# Patient Record
Sex: Male | Born: 1950 | Race: White | Hispanic: No | Marital: Married | Smoking: Former smoker
Health system: Southern US, Community
[De-identification: ages and names within clinical notes are randomized; demographics above are authoritative.]

## PROBLEM LIST (undated history)

## (undated) DIAGNOSIS — Z8679 Personal history of other diseases of the circulatory system: Secondary | ICD-10-CM

## (undated) DIAGNOSIS — K635 Polyp of colon: Secondary | ICD-10-CM

## (undated) DIAGNOSIS — M1990 Unspecified osteoarthritis, unspecified site: Secondary | ICD-10-CM

## (undated) DIAGNOSIS — E785 Hyperlipidemia, unspecified: Secondary | ICD-10-CM

## (undated) DIAGNOSIS — R9431 Abnormal electrocardiogram [ECG] [EKG]: Secondary | ICD-10-CM

## (undated) DIAGNOSIS — I2129 ST elevation (STEMI) myocardial infarction involving other sites: Secondary | ICD-10-CM

## (undated) DIAGNOSIS — I2510 Atherosclerotic heart disease of native coronary artery without angina pectoris: Secondary | ICD-10-CM

## (undated) DIAGNOSIS — I10 Essential (primary) hypertension: Secondary | ICD-10-CM

## (undated) HISTORY — DX: Unspecified osteoarthritis, unspecified site: M19.90

## (undated) HISTORY — PX: CARDIAC CATHETERIZATION: SHX172

## (undated) HISTORY — DX: Abnormal electrocardiogram (ECG) (EKG): R94.31

## (undated) HISTORY — DX: ST elevation (STEMI) myocardial infarction involving other sites: I21.29

## (undated) HISTORY — DX: Hyperlipidemia, unspecified: E78.5

## (undated) HISTORY — PX: ANGIOPLASTY: SHX39

## (undated) HISTORY — DX: Personal history of other diseases of the circulatory system: Z86.79

## (undated) HISTORY — DX: Essential (primary) hypertension: I10

---

## 1998-06-10 DIAGNOSIS — I2129 ST elevation (STEMI) myocardial infarction involving other sites: Secondary | ICD-10-CM

## 1998-06-10 HISTORY — DX: ST elevation (STEMI) myocardial infarction involving other sites: I21.29

## 1998-07-09 ENCOUNTER — Inpatient Hospital Stay (HOSPITAL_COMMUNITY): Admission: EM | Admit: 1998-07-09 | Discharge: 1998-07-12 | Payer: Self-pay | Admitting: Emergency Medicine

## 2004-08-14 ENCOUNTER — Ambulatory Visit: Payer: Self-pay | Admitting: Internal Medicine

## 2004-08-21 ENCOUNTER — Ambulatory Visit: Payer: Self-pay | Admitting: Internal Medicine

## 2005-03-13 ENCOUNTER — Ambulatory Visit: Payer: Self-pay | Admitting: Internal Medicine

## 2005-03-18 ENCOUNTER — Ambulatory Visit (HOSPITAL_COMMUNITY): Admission: RE | Admit: 2005-03-18 | Discharge: 2005-03-18 | Payer: Self-pay | Admitting: Internal Medicine

## 2005-03-18 ENCOUNTER — Ambulatory Visit: Payer: Self-pay | Admitting: Internal Medicine

## 2005-11-16 ENCOUNTER — Ambulatory Visit: Payer: Self-pay | Admitting: Internal Medicine

## 2007-04-07 IMAGING — US US SOFT TISSUE HEAD/NECK
1 series · 14 of 25 positions shown · non-contrast
Comparison: none

CLINICAL DATA: Goiter.
THYROID ULTRASOUND:
TECHNIQUE: Ultrasound examination of the thyroid gland and adjacent soft tissue structures was performed.

[Series 1: unknown · 0.11mm/px · 14 of 29 slices shown]
[im 1/29]
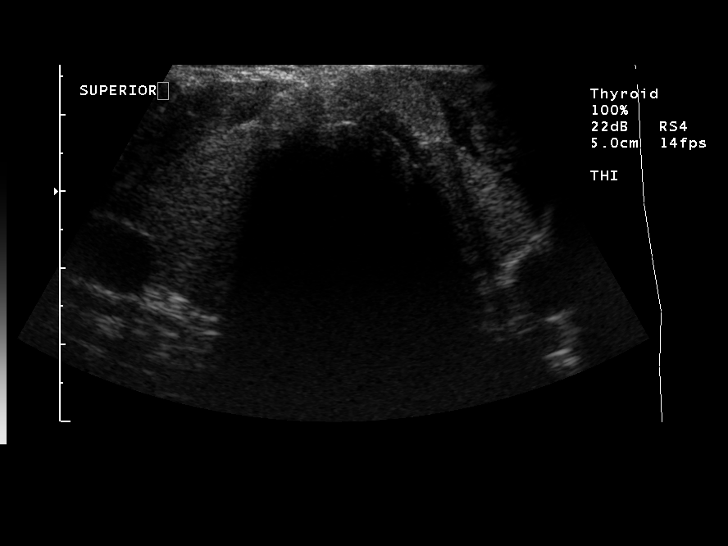
[im 3/29]
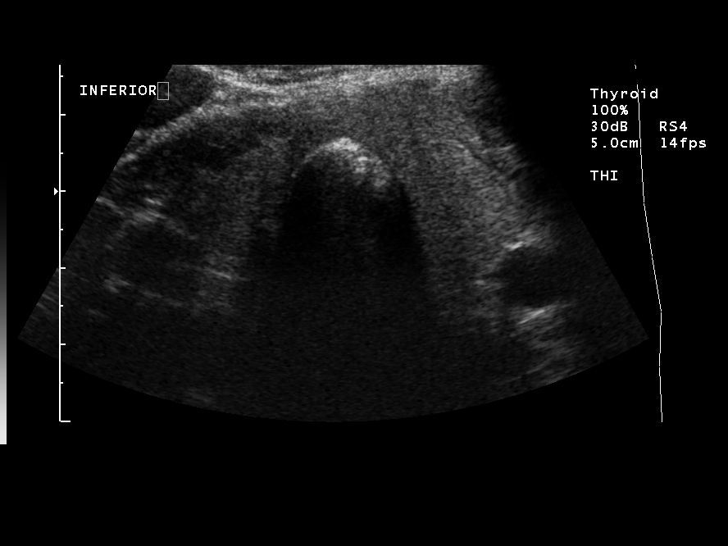
[im 5/29]
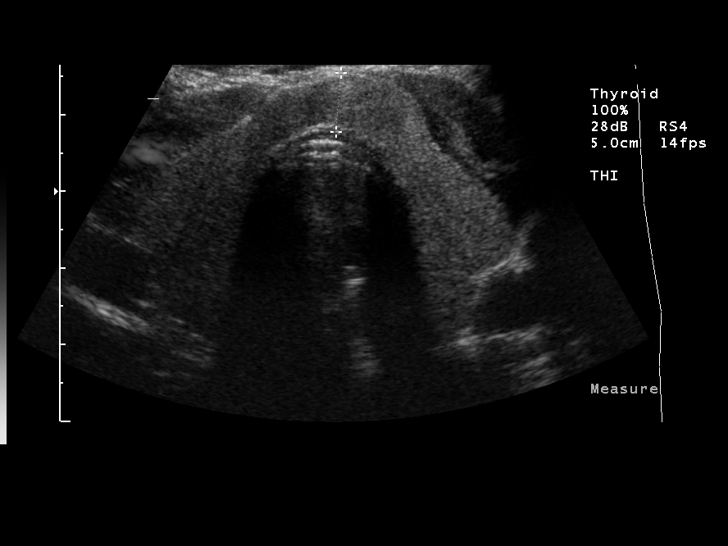
[im 8/29]
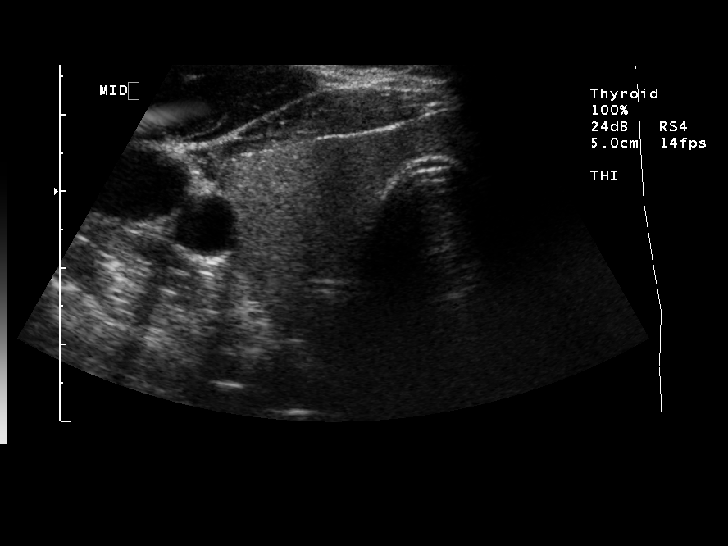
[im 10/29]
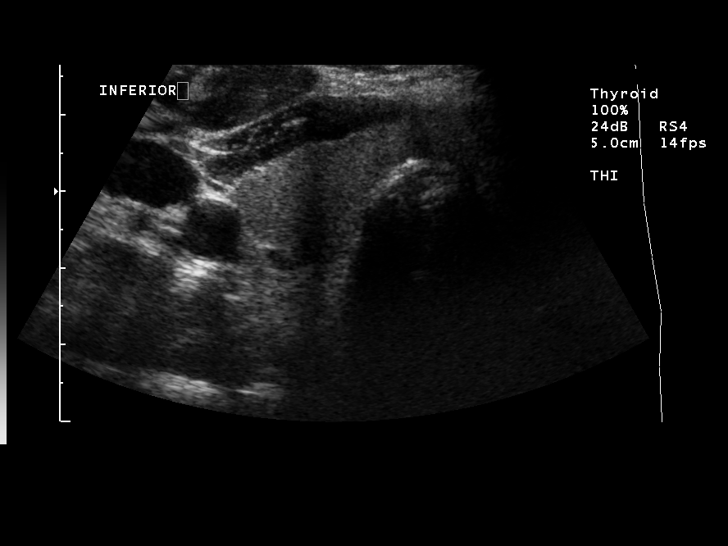
[im 11/29]
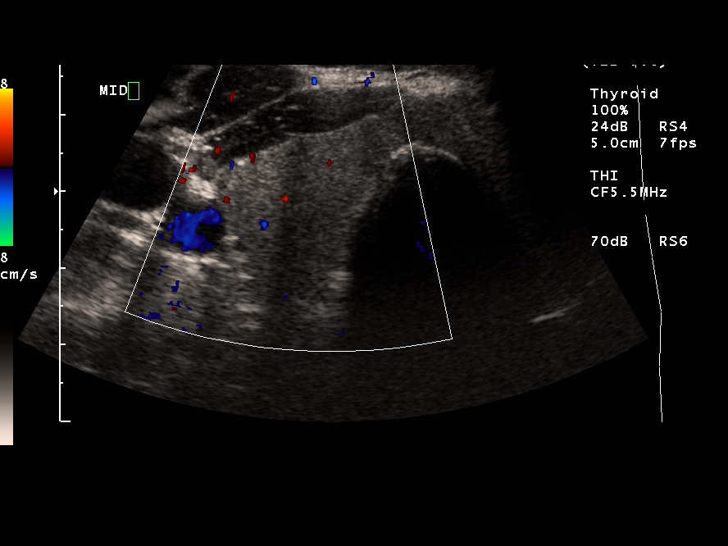
[im 13/29]
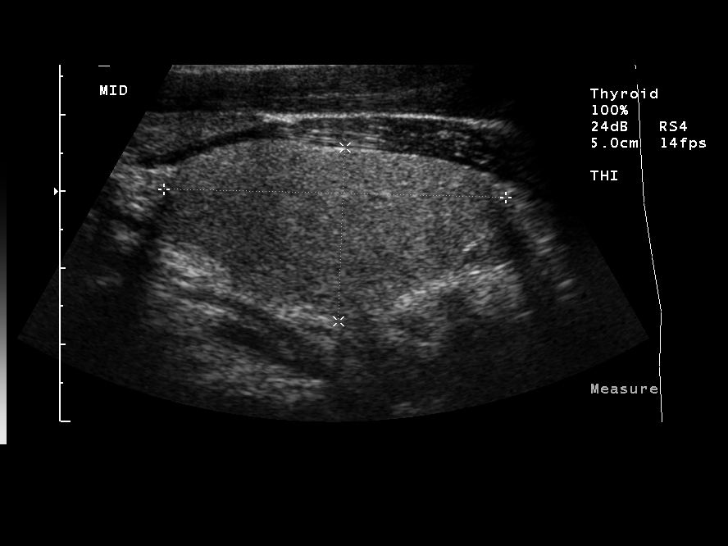
[im 16/29]
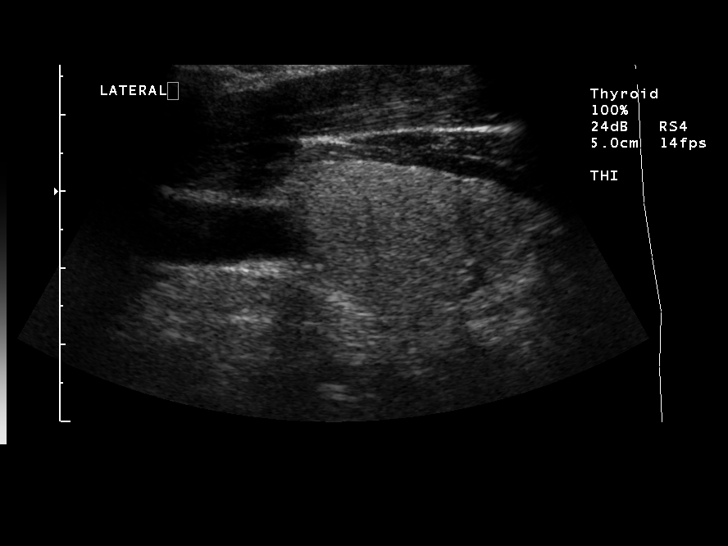
[im 18/29]
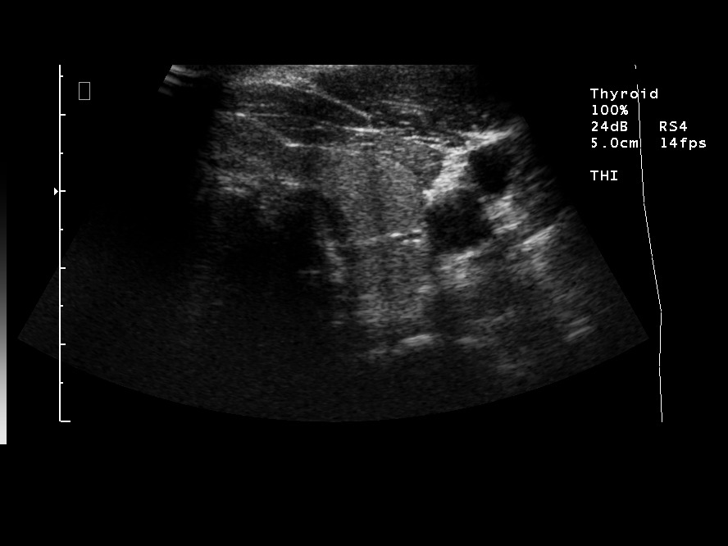
[im 19/29]
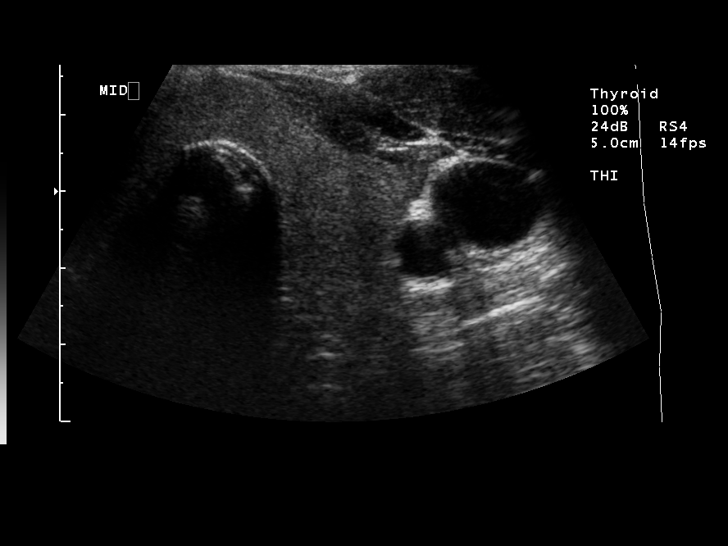
[im 22/29]
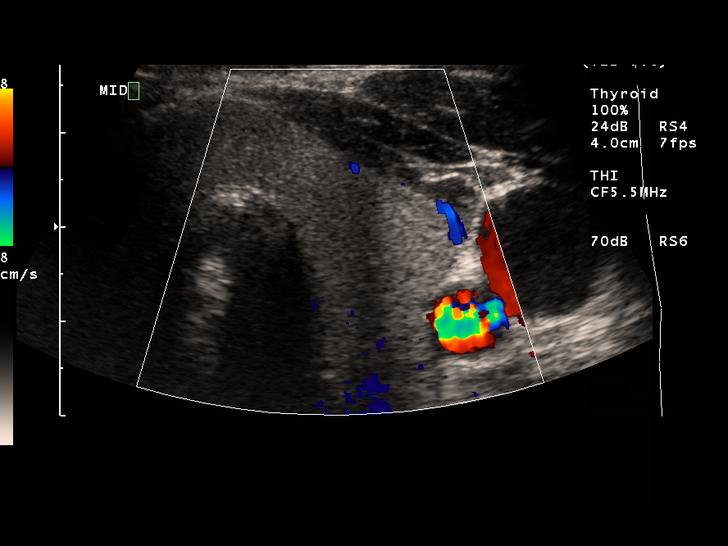
[im 24/29]
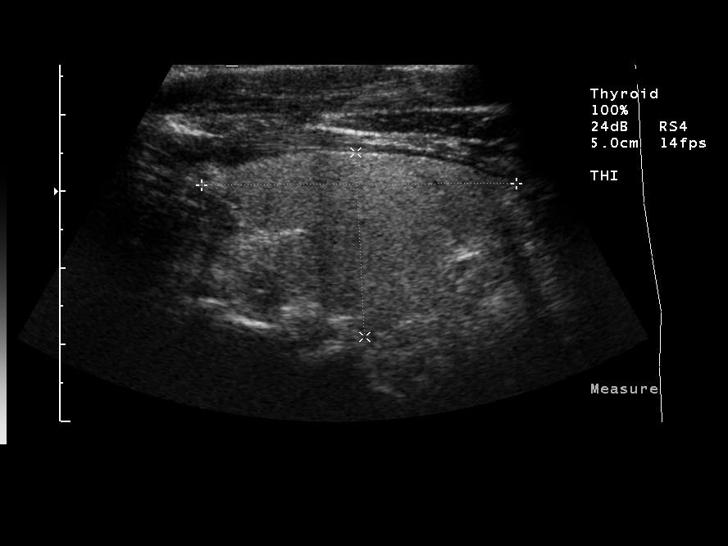
[im 26/29]
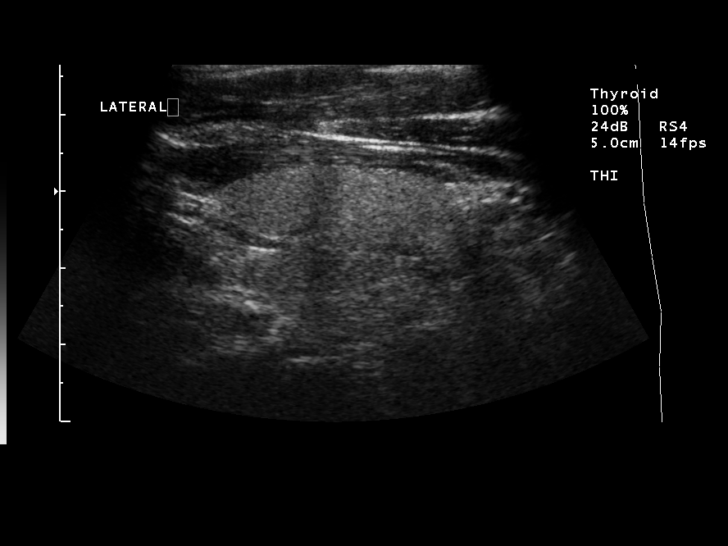
[im 29/29]
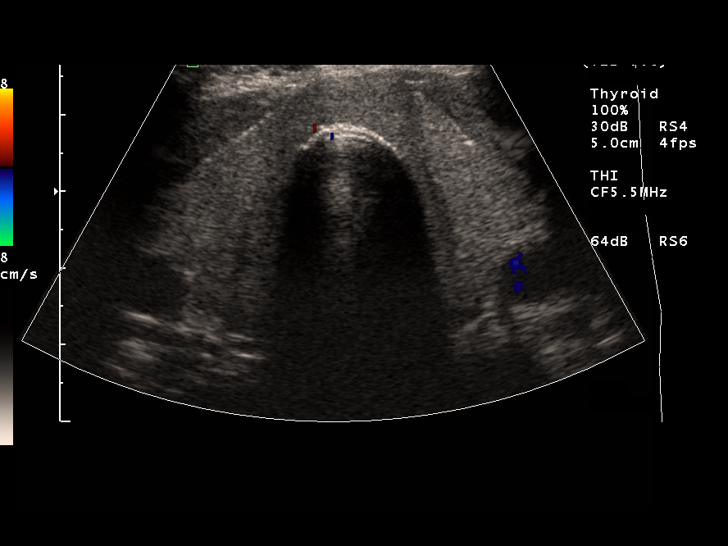

[14 of 25 positions shown; findings below may reference images not displayed]

FINDINGS: The scan demonstrates that the isthmus is slightly prominent.  On a few images, there is suggestion of a very subtle nodule on the left side of the isthmus measuring approximately 10 mm maximum diameter.  The remainder of the gland is homogeneous and normal in size.  The right lobe is 4.5 x 2.3 x 2.3 cm, and the left lobe is 4.1 x 2.4 x 1.8 cm.  The right side of the isthmus is 8 mm in thickness within normal limits.
IMPRESSION: Probable subtle nodule on the left side of the isthmus measuring approximately 1 cm in diameter. Otherwise normal thyroid ultrasound.  Given the solitary nature of the lesion in a male of this age, I recommend consideration of a percutaneous needle biopsy. It may be that a lesion will not be defined at the time of a possible biopsy, but I feel there is probably a subtle lesion in the left side of the isthmus.

## 2008-02-16 ENCOUNTER — Ambulatory Visit: Payer: Self-pay | Admitting: Internal Medicine

## 2008-02-16 LAB — CONVERTED CEMR LAB
ALT: 50 units/L (ref 0–53)
Albumin: 4.7 g/dL (ref 3.5–5.2)
Alkaline Phosphatase: 73 units/L (ref 39–117)
Cholesterol: 141 mg/dL (ref 0–200)
Potassium: 4.3 meq/L (ref 3.5–5.1)
Sodium: 139 meq/L (ref 135–145)
Total CHOL/HDL Ratio: 4.1
Total Protein: 7.5 g/dL (ref 6.0–8.3)
VLDL: 22 mg/dL (ref 0–40)

## 2008-02-20 ENCOUNTER — Ambulatory Visit: Payer: Self-pay

## 2008-07-06 ENCOUNTER — Encounter (INDEPENDENT_AMBULATORY_CARE_PROVIDER_SITE_OTHER): Payer: Self-pay | Admitting: *Deleted

## 2008-09-14 ENCOUNTER — Telehealth: Payer: Self-pay | Admitting: Internal Medicine

## 2008-11-12 ENCOUNTER — Encounter (INDEPENDENT_AMBULATORY_CARE_PROVIDER_SITE_OTHER): Payer: Self-pay | Admitting: *Deleted

## 2008-11-23 ENCOUNTER — Ambulatory Visit: Payer: Self-pay | Admitting: Internal Medicine

## 2008-11-23 DIAGNOSIS — E7800 Pure hypercholesterolemia, unspecified: Secondary | ICD-10-CM | POA: Insufficient documentation

## 2008-11-23 DIAGNOSIS — I2510 Atherosclerotic heart disease of native coronary artery without angina pectoris: Secondary | ICD-10-CM | POA: Insufficient documentation

## 2008-11-28 LAB — CONVERTED CEMR LAB
ALT: 33 units/L (ref 0–53)
AST: 28 units/L (ref 0–37)
Albumin: 4.3 g/dL (ref 3.5–5.2)
BUN: 22 mg/dL (ref 6–23)
CO2: 27 meq/L (ref 19–32)
Cholesterol: 154 mg/dL (ref 0–200)
Creatinine, Ser: 1.1 mg/dL (ref 0.4–1.5)
GFR calc non Af Amer: 72.95 mL/min (ref >60)
Glucose, Bld: 102 mg/dL — ABNORMAL HIGH (ref 70–99)
LDL Cholesterol: 90 mg/dL (ref 0–99)
Potassium: 4.3 meq/L (ref 3.5–5.1)
Total Bilirubin: 1.3 mg/dL — ABNORMAL HIGH (ref 0.3–1.2)
Triglycerides: 151 mg/dL — ABNORMAL HIGH (ref 0.0–149.0)

## 2008-12-20 ENCOUNTER — Ambulatory Visit: Payer: Self-pay | Admitting: Internal Medicine

## 2008-12-20 DIAGNOSIS — R9431 Abnormal electrocardiogram [ECG] [EKG]: Secondary | ICD-10-CM

## 2008-12-20 HISTORY — DX: Abnormal electrocardiogram (ECG) (EKG): R94.31

## 2009-11-26 ENCOUNTER — Ambulatory Visit: Payer: Self-pay | Admitting: Internal Medicine

## 2009-11-26 ENCOUNTER — Encounter: Payer: Self-pay | Admitting: Internal Medicine

## 2010-03-09 LAB — CONVERTED CEMR LAB
ALT: 41 units/L (ref 0–53)
AST: 31 units/L (ref 0–37)
Alkaline Phosphatase: 68 units/L (ref 39–117)
Bilirubin, Direct: 0.1 mg/dL (ref 0.0–0.3)
Cholesterol: 133 mg/dL (ref 0–200)
Direct LDL: 63.5 mg/dL
HDL: 35.2 mg/dL — ABNORMAL LOW (ref >39.00)
Hgb A1c MFr Bld: 5.9 % (ref 4.6–6.5)
Potassium: 4.8 meq/L (ref 3.5–5.1)
Sodium: 140 meq/L (ref 135–145)
Total CHOL/HDL Ratio: 4

## 2010-03-11 NOTE — Assessment & Plan Note (Signed)
Summary: per check out/sf   Visit Type:  Follow-up  CC:  No cardiac complaints.  History of Present Illness:  [name] [name] is a very pleasant 60 year old male with a history of coronary artery disease, hypertension, and hyperlipidemia. 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  Catheterization at that time showed an LAD 60-70% mid lesion, ramus 30% ostial lesion, circumflex was dominant with an occluded obtuse marginal branch which was angioplastied.  EF was 55%. He had a Cardiolite in 2004 which showed an EF of 66%.  No ischemia.   We saw him in January 2010 and ordered a f/u Myoview to assess for progressive CAD. This was normal with EF 67% no ischemia or infarct.   Returns for f/u. Remains active at work without CP or SOB. No palpitation or dizziness. Not exercising regularly. Taking meds without problems except for very occasionally flushing with Niasapan.  Has not had recent lipids.Has lost 7 pounds.  Current Medications (verified): 1)  Labetalol Hcl 200 Mg Tabs (Labetalol Hcl) .Marland Kitchen.. 1 Tab Once Daily 2)  Lipitor 80 Mg Tabs (Atorvastatin Calcium) .Marland Kitchen.. 1 Tablet By Mouth Once A Day 3)  Lisinopril 40 Mg Tabs (Lisinopril) .... Take 1 Tablet By Mouth Once A Day 4)  Niaspan 500 Mg Cr-Tabs (Niacin (Antihyperlipidemic)) .Marland Kitchen.. 1 Tablet By Mouth At Bedtime 5)  Nitrostat 0.4 Mg Subl (Nitroglycerin) .... Place 1 Tablet Under Tongue As Directed 6)  Fish Oil Concentrate 1000 Mg Caps (Omega-3 Fatty Acids) .... Two Daily 7)  Aspirin 81 Mg Tbec (Aspirin) .... Sometimes  Allergies (verified): No Known Drug Allergies  Review of Systems       As per HPI and past medical history; otherwise all systems negative.   Vital Signs:  Patient profile:   60 year old male Height:      66 inches Weight:      171.75 pounds BMI:     27.82 Pulse rate:   65 / minute Pulse rhythm:   regular Resp:     18 per  minute BP sitting:   114 / 80  (left arm) Cuff size:   large  Vitals Entered By: Vikki Ports (November 26, 2009 8:58 AM)  Physical Exam  General:  Well appearing. no resp difficulty HEENT: normal Neck: supple. no JVD. Carotids 2+ bilat; no bruits. No lymphadenopathy or thryomegaly appreciated. Cor: PMI nondisplaced. Regular rate & rhythm. No rubs, gallops, murmur. Lungs: clear Abdomen: soft, nontender, nondistended. No hepatosplenomegaly. No bruits or masses. Good bowel sounds. Extremities: no cyanosis, clubbing, rash, edema Neuro: alert & orientedx3, cranial nerves grossly intact. moves all 4 extremities w/o difficulty. affect pleasant    Impression & Recommendations:  Problem # 1:  CORONARY ATHEROSCLEROSIS NATIVE CORONARY ARTERY (ICD-414.01) Stable. No evidence of ischemia. Continue current regimen.  Problem # 2:  PURE HYPERCHOLESTEROLEMIA (ICD-272.0) Due for lipids today. Goal LDL < 70. Historically HDL has been low and refractory to treatment. That said, we did discuss neutral result of AIM-HIGH trial with Niaspan. He said he is tolerating well and has decided to continue.   Other Orders: EKG w/ Interpretation (93000) TLB-Lipid Panel (80061-LIPID) TLB-Hepatic/Liver Function Pnl (80076-HEPATIC) TLB-BMP (Basic Metabolic Panel-BMET) (80048-METABOL) TLB-A1C / Hgb A1C (Glycohemoglobin) (83036-A1C)  Patient Instructions: 1)  Your physician recommends that you schedule a follow-up appointment in: 9 months with Dr. Gala Romney 2)  Your physician recommends that you  have FASTING lipid profile,liver, bmp, HbA1C today.  3)  Your physician recommends that you continue on your current medications as directed. Please refer to the Current Medication list given to you today.

## 2010-06-12 ENCOUNTER — Other Ambulatory Visit: Payer: Self-pay | Admitting: *Deleted

## 2010-06-12 MED ORDER — ATORVASTATIN CALCIUM 80 MG PO TABS: 80.0000 mg | ORAL_TABLET | Freq: Every day | ORAL | Status: DC

## 2010-06-24 NOTE — Assessment & Plan Note (Signed)
[facility]                            CARDIOLOGY OFFICE NOTE   NAME:[name] [name], [name] [name]                    MRN:          [id number]  DATE:02/16/2008                            DOB:          Dec 04, 1950    INTERVAL HISTORY:  Mr. Ruacho is a very pleasant 60 year old male with  a history of coronary artery disease, hypertension, and hyperlipidemia.  He suffered an inferior lateral wall myocardial infarction in May 2000  and was treated with angioplasty, but no stenting of the left  circumflex.  Catheterization at that time showed an LAD 60-70% mid  lesion, ramus 30% ostial lesion, circumflex was dominant with an  occluded obtuse marginal branch which was angioplastied.  EF was 55%.  He had a Cardiolite in 2004 which showed an EF of 66%.  No ischemia.   He returns today for routine followup.  He is doing very well.  He has  not been as active as he had been before, but does do some walking  around the block.  No chest pain or shortness of breath.  He does note  over the holidays his blood pressure spiked up into the 150 range.  He  thinks this may have been due to the extra salt he was eating.  He has  not had any orthopnea, PND, or lower extremity edema.   CURRENT MEDICATIONS:  1. Aspirin 325 a day.  2. Niaspan 500 a day.  3. Lisinopril 40 a day.  4. Labetalol 200 t.i.d.  5. Lipitor 80 a day.   PHYSICAL EXAMINATION:  GENERAL:  He is well appearing in no acute  distress.  He ambulates around the clinic without any respiratory  difficulty.  VITAL SIGNS:  Blood pressure is 110/72, heart rate 66, weight is 174.  HEENT:  Normal.  NECK:  Supple.  No JVD.  Carotids are 2+ bilaterally without bruits.  There is no lymphadenopathy or thyromegaly.  CARDIAC:  PMI is nondisplaced.  Regular rate and rhythm.  No murmurs,  rubs, or gallops.  LUNGS:  Clear.  ABDOMEN:  Soft, nontender, nondistended.  No hepatosplenomegaly.  No  bruits.  No masses.  Good bowel  sounds.  EXTREMITIES:  Warm with no cyanosis, clubbing, or edema.  No rash.  NEUROLOGIC:  Alert and oriented x3.  Cranial nerves II through XII are  intact.  Moves all 4 extremities without difficulty.  Affect is  pleasant.   EKG shows sinus rhythm at a rate of 64.  There is J-point elevation  inferiorly which is likely early repolarization.   ASSESSMENT/PLAN:  1. Coronary artery disease.  This appears stable; however, he does      have fairly significant disease.  Given that he is not that active,      I would like to proceed with a treadmill Myoview just to make sure      he does not have worsening blockages.  2. Hypertension, currently well controlled.  I told him to be careful      with his salt intake.  3. Hyperlipidemia.  He is due for lipid panel today.   DISPOSITION:  I will see him back in 6 months for followup.     Bevelyn Buckles. Bensimhon, MD  Electronically Signed    DRB/MedQ  DD: 02/16/2008  DT: 02/17/2008  Job #: [id number]

## 2010-06-27 NOTE — Assessment & Plan Note (Signed)
[facility]                              CARDIOLOGY OFFICE NOTE   NAME:[name], [name]                    MRN:          [id number]  DATE:11/16/2005                            DOB:          07/29/1950    PATIENT IDENTIFICATION:  [name] is a very pleasant 60 year old male  who returns for routine followup.   PROBLEM LIST:  1. Coronary artery disease.      a.     Status post inferolateral myocardial infarction in May of 2000,       treated with percutaneous transluminal coronary angioplasty but no       stent.      b.     Cardiac catheterization at that time showed left main with minor       irregularities, left anterior descending 60% to 70% mid, which was       treated medically, ramus 30% ostial, left circumflex was dominant with       an occluded inferior obtuse marginal branch, which was angioplastied.       Ejection fraction 55%.      c.     Treadmill Cardiolite March 2004, good exercise tolerance,       ejection fraction 66%, no evidence of ischemia.  2. Hypertension.  3. Hyperlipidemia with LDL.  4. History of medication noncompliance.   CURRENT MEDICATIONS:  1. Aspirin 325 a day.  2. Labetalol 200 b.i.d.  3. Lipitor 40 a day.  4. Niaspan 500 a day.  5. Lisinopril 40 a day.   INTERVAL HISTORY:  Mr. Meter returns today for routine followup.  He is  doing quite well.  He has been watching his diet and exercising, and he has  lost just about 20 pounds.  He is not having chest pains, no dyspnea.  He  says his blood pressure has been somewhat better controlled.   PHYSICAL EXAMINATION:  He is well-appearing, in no acute distress, ambulates  around the clinic without any difficulty.  Respirations are unlabored.  Blood pressure is 130/70 in the left arm manually, heart rate 69, his weight  is 167.  HEENT:  Sclerae anicteric, EOMI.  There is no xanthelasma.  Mucous membranes  are moist.  NECK:  Supple, no JVD.  Carotids are 2+  bilaterally without any bruits.  There is no lymphadenopathy or thyromegaly.  CARDIAC:  Distant heart sounds, regular rate and rhythm, no obvious murmurs,  rubs or gallops.  LUNGS:  Clear.  ABDOMEN:  Soft, nontender, nondistended.  No hepatosplenomegaly, no bruits,  no masses.  EXTREMITIES:  Warm with no cyanosis, clubbing or edema.  NEUROLOGIC:  He is alert and oriented x3.  Cranial nerves II-XII are intact.  He moves all 4 extremities without difficulty.   EKG shows normal sinus rhythm at a rate of 69, with no ST-T wave changes.   ASSESSMENT:  1. Coronary artery disease.  This is stable without any ongoing angina.      Continue current therapy.  2. Hypertension, still suboptimally controlled.  We will increase his      labetalol to 300 b.i.d.  I  told him if he experiences significant      amount of fatigue with this, we can cut him back and start him on a      diuretic or amlodipine.  3. Hyperlipidemia.  We will check lipids today.   DISPOSITION:  We will see him back for routine evaluation in 6 months.  I  congratulated him on his diet and weight loss and exercise efforts, and  encouraged him to continue these.       Bevelyn Buckles. Bensimhon, MD     DRB/MedQ  DD:  11/16/2005  DT:  11/18/2005  Job #:  [id number]

## 2010-07-30 ENCOUNTER — Other Ambulatory Visit: Payer: Self-pay | Admitting: *Deleted

## 2010-07-30 MED ORDER — LISINOPRIL 40 MG PO TABS: 40.0000 mg | ORAL_TABLET | Freq: Every day | ORAL | Status: DC

## 2010-11-30 ENCOUNTER — Other Ambulatory Visit: Payer: Self-pay | Admitting: Internal Medicine

## 2011-04-08 ENCOUNTER — Other Ambulatory Visit (HOSPITAL_COMMUNITY): Payer: Self-pay | Admitting: Internal Medicine

## 2011-05-05 ENCOUNTER — Other Ambulatory Visit: Payer: BC Managed Care – PPO

## 2011-05-05 ENCOUNTER — Ambulatory Visit (INDEPENDENT_AMBULATORY_CARE_PROVIDER_SITE_OTHER): Payer: BC Managed Care – PPO | Admitting: Internal Medicine

## 2011-05-05 VITALS — BP 110/64 | HR 53 | Ht 66.0 in | Wt 164.0 lb

## 2011-05-05 DIAGNOSIS — E7800 Pure hypercholesterolemia, unspecified: Secondary | ICD-10-CM

## 2011-05-05 DIAGNOSIS — I2510 Atherosclerotic heart disease of native coronary artery without angina pectoris: Secondary | ICD-10-CM

## 2011-05-05 LAB — CBC WITH DIFFERENTIAL/PLATELET
Basophils Absolute: 0 10*3/uL (ref 0.0–0.1)
HCT: 43.2 % (ref 39.0–52.0)
Hemoglobin: 14.6 g/dL (ref 13.0–17.0)
Lymphs Abs: 1.7 10*3/uL (ref 0.7–4.0)
MCV: 86.2 fl (ref 78.0–100.0)
Neutro Abs: 3.8 10*3/uL (ref 1.4–7.7)
Platelets: 192 10*3/uL (ref 150.0–400.0)
RBC: 5.02 Mil/uL (ref 4.22–5.81)
WBC: 6.3 10*3/uL (ref 4.5–10.5)

## 2011-05-05 LAB — BASIC METABOLIC PANEL
BUN: 22 mg/dL (ref 6–23)
CO2: 29 mEq/L (ref 19–32)
Chloride: 102 mEq/L (ref 96–112)
GFR: 82.66 mL/min (ref >60.00)
Glucose, Bld: 111 mg/dL — ABNORMAL HIGH (ref 70–99)
Potassium: 4.5 mEq/L (ref 3.5–5.1)

## 2011-05-05 LAB — LIPID PANEL
HDL: 47 mg/dL (ref >39.00)
LDL Cholesterol: 38 mg/dL (ref 0–99)
Triglycerides: 41 mg/dL (ref 0.0–149.0)
VLDL: 8.2 mg/dL (ref 0.0–40.0)

## 2011-05-05 LAB — HEMOGLOBIN A1C: Hgb A1c MFr Bld: 5.4 % (ref 4.6–6.5)

## 2011-05-05 LAB — HEPATIC FUNCTION PANEL
Alkaline Phosphatase: 53 U/L (ref 39–117)
Bilirubin, Direct: 0.2 mg/dL (ref 0.0–0.3)
Total Bilirubin: 0.6 mg/dL (ref 0.3–1.2)
Total Protein: 7.4 g/dL (ref 6.0–8.3)

## 2011-05-05 NOTE — Assessment & Plan Note (Signed)
LDL at goal at last visit TGs mildly up but has been dieting. Will recheck. Continue statin. Discussed recent Niaspan studies with minimal clinical benefit but he ould like to continue. I think that is reasonable particularly given high TGs.

## 2011-05-05 NOTE — Assessment & Plan Note (Signed)
No evidence of ischemia. Continue current regimen.   

## 2011-05-05 NOTE — Progress Notes (Signed)
HPI:   [name] [name] is a very pleasant 61 year old male with a history of coronary artery disease, hypertension, and hyperlipidemia. 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  Catheterization at that time showed an LAD 60-70% mid lesion, ramus 30% ostial lesion, circumflex was dominant with an occluded obtuse marginal branch which was angioplastied.  EF was 55%. He had a Cardiolite in 2004 which showed an EF of 66%.  No ischemia.  We saw him in January 2010 and ordered a f/u Myoview to assess for progressive CAD. This was normal with EF 67% no ischemia or infarct.   Returns for f/u. Remains active at work without CP or SOB. No palpitation or dizziness. Not exercising regularly. Taking meds without problems including Niasapan.  Has lost 14 pounds over past year.   ROS: All systems negative except as listed in HPI, PMH and Problem List.  Past Medical History  Diagnosis Date  . Hx of coronary artery disease   . Hypertension   . Hyperlipidemia   . Myocardial infarction (lateral wall) May 2000    inferior lateral wall myocardial infarction    Current Outpatient Prescriptions  Medication Sig Dispense Refill  . aspirin 81 MG tablet Take 81 mg by mouth daily.      Marland Kitchen atorvastatin (LIPITOR) 80 MG tablet Take 1 tablet (80 mg total) by mouth daily.  30 tablet  11  . labetalol (NORMODYNE) 200 MG tablet TAKE 1 TABLET 3 TIMES A DAY  90 tablet  1  . lisinopril (PRINIVIL,ZESTRIL) 40 MG tablet Take 1 tablet (40 mg total) by mouth daily.  30 tablet  12  . NIASPAN 500 MG CR tablet TAKE 1 TABLET AT BEDTIME AS DIRECTED WITH 1 ASPIRIN 1/2 HOUR PRIOR TO DOSE  30 tablet  3     PHYSICAL EXAM: Filed Vitals:   05/05/11 0931  BP: 110/64  Pulse: 53   General:  Well appearing. No resp difficulty HEENT: normal Neck: supple. JVP flat. Carotids 2+ bilaterally; no bruits. No lymphadenopathy or thryomegaly  appreciated. Cor: PMI normal. Regular rate & rhythm. No rubs, gallops or murmurs. Lungs: clear Abdomen: soft, nontender, nondistended. No hepatosplenomegaly. No bruits or masses. Good bowel sounds. Extremities: no cyanosis, clubbing, rash, edema. Varicose  Veins on R Neuro: alert & orientedx3, cranial nerves grossly intact. Moves all 4 extremities w/o difficulty. Affect pleasant.    ECG: Sinus bradycardia 53. No ST-T wave abnormalities.     ASSESSMENT & PLAN:

## 2011-05-05 NOTE — Patient Instructions (Signed)
Your physician recommends that you schedule a follow-up appointment in: 3 MONTHS Your physician recommends that you return for lab work in: TODAY   

## 2011-05-07 ENCOUNTER — Encounter: Payer: Self-pay | Admitting: *Deleted

## 2011-07-08 ENCOUNTER — Other Ambulatory Visit: Payer: Self-pay

## 2011-07-08 MED ORDER — NIACIN ER (ANTIHYPERLIPIDEMIC) 500 MG PO TBCR: 500.0000 mg | EXTENDED_RELEASE_TABLET | Freq: Every day | ORAL | Status: DC

## 2011-07-09 ENCOUNTER — Other Ambulatory Visit (HOSPITAL_COMMUNITY): Payer: Self-pay | Admitting: *Deleted

## 2011-07-09 MED ORDER — LABETALOL HCL 200 MG PO TABS: ORAL_TABLET | ORAL | Status: DC

## 2011-07-09 MED ORDER — ATORVASTATIN CALCIUM 80 MG PO TABS: 80.0000 mg | ORAL_TABLET | Freq: Every day | ORAL | Status: DC

## 2011-07-21 ENCOUNTER — Other Ambulatory Visit (INDEPENDENT_AMBULATORY_CARE_PROVIDER_SITE_OTHER): Payer: BC Managed Care – PPO

## 2011-07-21 ENCOUNTER — Other Ambulatory Visit: Payer: Self-pay | Admitting: *Deleted

## 2011-07-21 ENCOUNTER — Ambulatory Visit: Payer: BC Managed Care – PPO | Admitting: Internal Medicine

## 2011-07-21 DIAGNOSIS — E7800 Pure hypercholesterolemia, unspecified: Secondary | ICD-10-CM

## 2011-07-21 LAB — BASIC METABOLIC PANEL
BUN: 29 mg/dL — ABNORMAL HIGH (ref 6–23)
CO2: 24 mEq/L (ref 19–32)
Calcium: 9.2 mg/dL (ref 8.4–10.5)
Chloride: 107 mEq/L (ref 96–112)
Creatinine, Ser: 1.1 mg/dL (ref 0.4–1.5)
Potassium: 4.3 mEq/L (ref 3.5–5.1)
Sodium: 139 mEq/L (ref 135–145)

## 2011-07-21 LAB — HEPATIC FUNCTION PANEL
AST: 26 U/L (ref 0–37)
Alkaline Phosphatase: 54 U/L (ref 39–117)
Total Protein: 7.1 g/dL (ref 6.0–8.3)

## 2011-07-21 LAB — LIPID PANEL
Cholesterol: 111 mg/dL (ref 0–200)
HDL: 49.3 mg/dL (ref >39.00)
Triglycerides: 74 mg/dL (ref 0.0–149.0)
VLDL: 14.8 mg/dL (ref 0.0–40.0)

## 2011-07-31 ENCOUNTER — Encounter: Payer: Self-pay | Admitting: *Deleted

## 2011-08-24 ENCOUNTER — Other Ambulatory Visit: Payer: Self-pay | Admitting: *Deleted

## 2011-08-24 MED ORDER — LISINOPRIL 40 MG PO TABS: 40.0000 mg | ORAL_TABLET | Freq: Every day | ORAL | Status: DC

## 2011-08-25 ENCOUNTER — Other Ambulatory Visit: Payer: Self-pay | Admitting: *Deleted

## 2011-08-25 MED ORDER — LISINOPRIL 40 MG PO TABS: 40.0000 mg | ORAL_TABLET | Freq: Every day | ORAL | Status: DC

## 2012-07-25 ENCOUNTER — Other Ambulatory Visit (HOSPITAL_COMMUNITY): Payer: Self-pay | Admitting: *Deleted

## 2012-07-25 MED ORDER — ATORVASTATIN CALCIUM 80 MG PO TABS: 80.0000 mg | ORAL_TABLET | Freq: Every day | ORAL | Status: DC

## 2012-08-08 ENCOUNTER — Other Ambulatory Visit (HOSPITAL_COMMUNITY): Payer: Self-pay | Admitting: *Deleted

## 2012-08-08 MED ORDER — LABETALOL HCL 200 MG PO TABS: ORAL_TABLET | ORAL | Status: DC

## 2012-08-09 ENCOUNTER — Other Ambulatory Visit: Payer: Self-pay | Admitting: *Deleted

## 2012-08-10 MED ORDER — NIACIN ER (ANTIHYPERLIPIDEMIC) 500 MG PO TBCR: 500.0000 mg | EXTENDED_RELEASE_TABLET | Freq: Every day | ORAL | Status: DC

## 2012-09-06 ENCOUNTER — Other Ambulatory Visit (HOSPITAL_COMMUNITY): Payer: Self-pay | Admitting: *Deleted

## 2012-09-06 MED ORDER — LISINOPRIL 40 MG PO TABS: 40.0000 mg | ORAL_TABLET | Freq: Every day | ORAL | Status: DC

## 2012-10-26 ENCOUNTER — Other Ambulatory Visit (HOSPITAL_COMMUNITY): Payer: Self-pay | Admitting: Internal Medicine

## 2012-10-28 ENCOUNTER — Ambulatory Visit (INDEPENDENT_AMBULATORY_CARE_PROVIDER_SITE_OTHER): Payer: PRIVATE HEALTH INSURANCE | Admitting: Family Medicine

## 2012-10-28 ENCOUNTER — Encounter: Payer: Self-pay | Admitting: Family Medicine

## 2012-10-28 VITALS — BP 142/86 | Temp 97.7°F | Ht 66.0 in | Wt 157.0 lb

## 2012-10-28 DIAGNOSIS — H6120 Impacted cerumen, unspecified ear: Secondary | ICD-10-CM

## 2012-10-28 DIAGNOSIS — I10 Essential (primary) hypertension: Secondary | ICD-10-CM | POA: Insufficient documentation

## 2012-10-28 DIAGNOSIS — Z23 Encounter for immunization: Secondary | ICD-10-CM

## 2012-10-28 DIAGNOSIS — Z0000 Encounter for general adult medical examination without abnormal findings: Secondary | ICD-10-CM

## 2012-10-28 DIAGNOSIS — H6123 Impacted cerumen, bilateral: Secondary | ICD-10-CM

## 2012-10-28 DIAGNOSIS — E7800 Pure hypercholesterolemia, unspecified: Secondary | ICD-10-CM

## 2012-10-28 DIAGNOSIS — I2510 Atherosclerotic heart disease of native coronary artery without angina pectoris: Secondary | ICD-10-CM

## 2012-10-28 LAB — BASIC METABOLIC PANEL
BUN: 23 mg/dL (ref 6–23)
CO2: 27 mEq/L (ref 19–32)
Creatinine, Ser: 1 mg/dL (ref 0.4–1.5)
GFR: 81.3 mL/min (ref >60.00)
Glucose, Bld: 96 mg/dL (ref 70–99)

## 2012-10-28 LAB — LIPID PANEL
Total CHOL/HDL Ratio: 2
VLDL: 8.6 mg/dL (ref 0.0–40.0)

## 2012-10-28 NOTE — Patient Instructions (Addendum)
-  We have ordered labs or studies at this visit. It can take up to 1-2 weeks for results and processing. We will contact you with instructions IF your results are abnormal. Normal results will be released to your [facility]. If you have not heard from [place] or can not find your results in [facility] in 2 weeks please contact our office.  -PLEASE SIGN UP FOR MYCHART TODAY   We recommend the following healthy lifestyle measures: - eat a healthy diet consisting of lots of vegetables, fruits, beans, nuts, seeds, healthy meats such as white chicken and fish and whole grains.  - avoid fried foods, fast food, processed foods, sodas, red meet and other fattening foods.  - get a least 150 minutes of aerobic exercise per week.   For your ears try the over the counter ear cleaning kit that we discussed. If this does not work in 1 week please call the ear doctor for an appointment with the number we gave you.  Follow up in: with your cardiologist in the next few months Yearly with me or as needed.

## 2012-10-28 NOTE — Progress Notes (Signed)
Chief Complaint  Patient presents with  . Establish Care    HPI:  [name] [name] is here to establish care. Reports cardiologist wanted him to get family doctor. Originally from [place] - but in states for 20 years.  Hx of CAD/HLD/HTN: -followed by Dr. Gala Romney -takes asa, lipitor, labetolol, lisinopril, niacin -no CP, SOB, DOE, palpitation, swelling -exercise bike 10 minutes per day; diet is ok - limits bread, fats -reviewed last cards notes and had been advised to follow up in 3 months but has not seen cards since last year  Ears Clogged: -about 1 month -a little decreased hearing  Has the following chronic problems and concerns today:  Patient Active Problem List   Diagnosis Date Noted  . Essential hypertension, benign 10/28/2012  . PURE HYPERCHOLESTEROLEMIA 11/23/2008  . CORONARY ATHEROSCLEROSIS NATIVE CORONARY ARTERY 11/23/2008    Health Maintenance: -refused colon cancer screening -refused shingles vaccine -agreed to get flu and tdap today  ROS: See pertinent positives and negatives per HPI.  Past Medical History  Diagnosis Date  . Hx of coronary artery disease   . Hypertension   . Hyperlipidemia   . Myocardial infarction (lateral wall) May 2000    inferior lateral wall myocardial infarction  . ABNORMAL EKG 12/20/2008    Qualifier: Diagnosis of  By: Gala Romney, MD, Trixie Dredge     Family History  Problem Relation Age of Onset  . Heart disease Mother   . Hyperlipidemia Mother   . Hypertension Mother   . Diabetes Mother   . Diabetes Brother     History   Social History  . Marital Status: Married    Spouse Name: N/A    Number of Children: N/A  . Years of Education: N/A   Social History Main Topics  . Smoking status: Former Smoker    Types: Cigarettes    Quit date: 05/05/1991  . Smokeless tobacco: Never Used  . Alcohol Use: Yes     Comment: rare; 1-2 drinks  . Drug Use: No  . Sexual Activity: None   Other Topics Concern  . None    Social History Narrative   Work or School: Printmaker Situation: lives with wife and son      Spiritual Beliefs: none      Lifestyle: CV exercise 10 minutes per day, healthy             Current outpatient prescriptions:aspirin 81 MG tablet, Take 81 mg by mouth daily., Disp: , Rfl: ;  atorvastatin (LIPITOR) 80 MG tablet, Take 1 tablet (80 mg total) by mouth daily., Disp: 30 tablet, Rfl: 3;  labetalol (NORMODYNE) 200 MG tablet, TAKE 1 TABLET BY MOUTH 3 TIMES A DAY, Disp: 90 tablet, Rfl: 0;  lisinopril (PRINIVIL,ZESTRIL) 40 MG tablet, Take 1 tablet (40 mg total) by mouth daily., Disp: 30 tablet, Rfl: 1 niacin (NIASPAN) 500 MG CR tablet, Take 1 tablet (500 mg total) by mouth at bedtime., Disp: 30 tablet, Rfl: 0;  nitroGLYCERIN (NITROSTAT) 0.4 MG SL tablet, Place 0.4 mg under the tongue every 5 (five) minutes as needed for chest pain., Disp: , Rfl:   EXAM:  Filed Vitals:   10/28/12 1104  BP: 142/86  Temp: 97.7 F (36.5 C)    Body mass index is 25.35 kg/(m^2).  GENERAL: vitals reviewed and listed above, alert, oriented, appears well hydrated and in no acute distress  HEENT: atraumatic, conjunttiva clear, no obvious abnormalities on inspection of external nose and ears, narrow ear canals  with impacted cerumen bilat  NECK: no obvious masses on inspection  LUNGS: clear to auscultation bilaterally, no wheezes, rales or rhonchi, good air movement  CV: HRRR, no peripheral edema  MS: moves all extremities without noticeable abnormality  PSYCH: pleasant and cooperative, no obvious depression or anxiety  ASSESSMENT AND PLAN:  Discussed the following assessment and plan:  Visit for preventive health examination - Plan: Hemoglobin A1c  Essential hypertension, benign - Plan: Basic metabolic panel  CORONARY ATHEROSCLEROSIS NATIVE CORONARY ARTERY - Plan: Basic metabolic panel, lipids, hgbA1c, cont tx per cards -followed by cardiologist  Pure hypercholesterolemia - Plan:  Lipid Panel  Cerumen impaction, bilateral -advised otc ear cleaning drops and ENT for removal of wax if not resolved in 1 week, advised not to use qtips  -We reviewed the PMH, PSH, FH, SH, Meds and Allergies. -We provided refills for any medications we will prescribe as needed. -We addressed current concerns per orders and patient instructions. -We have advised patient to follow up per instructions below. -FASTING LABS today -discussed level a and b USPSTF recs -he refused colon cancer screening and shingles vaccine -tdap and flu vaccines given today -follow up pending labs and yearly   -Patient advised to return or notify a doctor immediately if symptoms worsen or persist or new concerns arise.  Patient Instructions  -We have ordered labs or studies at this visit. It can take up to 1-2 weeks for results and processing. We will contact you with instructions IF your results are abnormal. Normal results will be released to your [facility]. If you have not heard from [place] or can not find your results in [facility] in 2 weeks please contact our office.  -PLEASE SIGN UP FOR MYCHART TODAY   We recommend the following healthy lifestyle measures: - eat a healthy diet consisting of lots of vegetables, fruits, beans, nuts, seeds, healthy meats such as white chicken and fish and whole grains.  - avoid fried foods, fast food, processed foods, sodas, red meet and other fattening foods.  - get a least 150 minutes of aerobic exercise per week.   For your ears try the over the counter ear cleaning kit that we discussed. If this does not work in 1 week please call the ear doctor for an appointment with the number we gave you.  Follow up in: with your cardiologist in the next few months Yearly with me or as needed.      Kriste Basque R.

## 2012-10-28 NOTE — Addendum Note (Signed)
Addended by: Azucena Freed on: 10/28/2012 11:53 AM   Modules accepted: Orders

## 2012-10-29 NOTE — Progress Notes (Signed)
Quick Note:  Left a message for return call. ______ 

## 2012-10-31 ENCOUNTER — Encounter: Payer: Self-pay | Admitting: Family Medicine

## 2012-10-31 NOTE — Progress Notes (Signed)
Quick Note:  Pt came in to office and got a copy of labs. ______

## 2012-11-02 ENCOUNTER — Other Ambulatory Visit: Payer: Self-pay

## 2012-11-02 MED ORDER — LISINOPRIL 40 MG PO TABS: 40.0000 mg | ORAL_TABLET | Freq: Every day | ORAL | Status: DC

## 2012-11-10 ENCOUNTER — Other Ambulatory Visit (HOSPITAL_COMMUNITY): Payer: Self-pay | Admitting: *Deleted

## 2012-11-10 MED ORDER — LISINOPRIL 40 MG PO TABS: 40.0000 mg | ORAL_TABLET | Freq: Every day | ORAL | Status: DC

## 2012-12-04 ENCOUNTER — Other Ambulatory Visit (HOSPITAL_COMMUNITY): Payer: Self-pay | Admitting: Internal Medicine

## 2012-12-07 ENCOUNTER — Other Ambulatory Visit (HOSPITAL_COMMUNITY): Payer: Self-pay | Admitting: Family Medicine

## 2013-02-17 ENCOUNTER — Other Ambulatory Visit (HOSPITAL_COMMUNITY): Payer: Self-pay | Admitting: Family Medicine

## 2013-05-03 ENCOUNTER — Other Ambulatory Visit (HOSPITAL_COMMUNITY): Payer: Self-pay | Admitting: Family Medicine

## 2013-06-19 ENCOUNTER — Other Ambulatory Visit (HOSPITAL_COMMUNITY): Payer: Self-pay | Admitting: Family Medicine

## 2013-06-20 NOTE — Telephone Encounter (Signed)
Do you want to refill this rx? 

## 2013-07-24 ENCOUNTER — Ambulatory Visit (INDEPENDENT_AMBULATORY_CARE_PROVIDER_SITE_OTHER): Payer: PRIVATE HEALTH INSURANCE | Admitting: Family Medicine

## 2013-07-24 ENCOUNTER — Encounter: Payer: Self-pay | Admitting: Family Medicine

## 2013-07-24 VITALS — BP 116/78 | HR 70 | Temp 98.1°F | Ht 66.0 in | Wt 153.5 lb

## 2013-07-24 DIAGNOSIS — I10 Essential (primary) hypertension: Secondary | ICD-10-CM

## 2013-07-24 DIAGNOSIS — I2510 Atherosclerotic heart disease of native coronary artery without angina pectoris: Secondary | ICD-10-CM

## 2013-07-24 DIAGNOSIS — E7800 Pure hypercholesterolemia, unspecified: Secondary | ICD-10-CM

## 2013-07-24 LAB — BASIC METABOLIC PANEL
BUN: 33 mg/dL — ABNORMAL HIGH (ref 6–23)
CHLORIDE: 105 meq/L (ref 96–112)
CO2: 25 meq/L (ref 19–32)
Calcium: 9.6 mg/dL (ref 8.4–10.5)
Creatinine, Ser: 1.4 mg/dL (ref 0.4–1.5)
GFR: 54.38 mL/min — ABNORMAL LOW (ref >60.00)
Glucose, Bld: 114 mg/dL — ABNORMAL HIGH (ref 70–99)
Potassium: 5.3 mEq/L — ABNORMAL HIGH (ref 3.5–5.1)
SODIUM: 139 meq/L (ref 135–145)

## 2013-07-24 LAB — LIPID PANEL
Cholesterol: 112 mg/dL (ref 0–200)
HDL: 52 mg/dL (ref >39.00)
LDL Cholesterol: 51 mg/dL (ref 0–99)
NonHDL: 60
Total CHOL/HDL Ratio: 2
Triglycerides: 46 mg/dL (ref 0.0–149.0)
VLDL: 9.2 mg/dL (ref 0.0–40.0)

## 2013-07-24 MED ORDER — ATORVASTATIN CALCIUM 80 MG PO TABS: 80.0000 mg | ORAL_TABLET | Freq: Every day | ORAL | Status: DC

## 2013-07-24 MED ORDER — LABETALOL HCL 200 MG PO TABS: ORAL_TABLET | ORAL | Status: DC

## 2013-07-24 MED ORDER — NITROGLYCERIN 0.4 MG SL SUBL: 0.4000 mg | SUBLINGUAL_TABLET | SUBLINGUAL | Status: DC | PRN

## 2013-07-24 MED ORDER — LISINOPRIL 40 MG PO TABS: ORAL_TABLET | ORAL | Status: DC

## 2013-07-24 NOTE — Progress Notes (Signed)
No chief complaint on file.   HPI:  Follow up:  Note: I saw this patient once in 10/2012  1)HTN: -medications include: labetolol 200mg  tid and lisinopril 40 mg daily -reports: doing great - exercises daily - needs refills on all medications -denies: CP, SOB, HA, vision changes, swelling  2)CAD: -hx inferior lat wall MI in 06/1998, normal myoview per cardiology notes in 02/2008, followed by Dr. Haroldine Laws in Cardiology (per notes he was to follow up closely with cardiologist after his last OV in 2013, but appears he did not) -medications include: ASA 81mg  qs, lipitor 80 mg qd, labetolol 200mg  tid, lisinopril 40 mg daily and niacin 500mg  daily (cardiologist ok with stopping niacin per notes, but pt opted to continue) - also has nitrostat -reports: re[orts doing great -denies: CP, SOB, DOE, Orthopnea, Palpitations, Swelling  3)HLD: -medications include: lipitor 80mg  daily and niacin 500mg  daily - wants to continue the niacin as feels good taking it -stable   Lab Results  Component Value Date   CHOL 118 10/28/2012   HDL 49.30 10/28/2012   LDLCALC 60 10/28/2012   LDLDIRECT 63.5 11/26/2009   TRIG 43.0 10/28/2012   CHOLHDL 2 10/28/2012     ROS: See pertinent positives and negatives per HPI.  Past Medical History  Diagnosis Date  . Hx of coronary artery disease   . Hypertension   . Hyperlipidemia   . Myocardial infarction (lateral wall) May 2000    inferior lateral wall myocardial infarction  . ABNORMAL EKG 12/20/2008    Qualifier: Diagnosis of  By: Haroldine Laws, MD, Eileen Stanford     Past Surgical History  Procedure Laterality Date  . Angioplasty    . Cardiac catheterization       LAD 60-70% mid lesion, ramus 30% ostial lesion, circumflex was dominant with an occluded obtuse marginal branch which was angioplastied.  EF was 55%.     Family History  Problem Relation Age of Onset  . Heart disease Mother   . Hyperlipidemia Mother   . Hypertension Mother   . Diabetes Mother   .  Diabetes Brother     History   Social History  . Marital Status: Married    Spouse Name: N/A    Number of Children: N/A  . Years of Education: N/A   Social History Main Topics  . Smoking status: Former Smoker    Types: Cigarettes    Quit date: 05/05/1991  . Smokeless tobacco: Never Used  . Alcohol Use: Yes     Comment: rare; 1-2 drinks  . Drug Use: No  . Sexual Activity: None   Other Topics Concern  . 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, Disp: 30 tablet,  Rfl: 0;  nitroGLYCERIN (NITROSTAT) 0.4 MG SL tablet, Place 1 tablet (0.4 mg total) under the tongue every 5 (five) minutes as needed for chest pain., Disp: 10 tablet, Rfl: 3  EXAM:  Filed Vitals:   07/24/13 0959  BP: 116/78  Pulse: 70  Temp: 98.1 F (36.7 C)    Body mass index is 24.79 kg/(m^2).  GENERAL: vitals reviewed and listed above, alert, oriented, appears well hydrated and in no acute distress  HEENT: atraumatic, conjunttiva clear, no obvious abnormalities on inspection of external nose and ears  NECK: no obvious masses on inspection  LUNGS: clear to auscultation bilaterally, no wheezes, rales or rhonchi, good air movement  CV: HRRR, no peripheral edema  MS: moves all extremities without noticeable abnormality  PSYCH: pleasant and cooperative, no  obvious depression or anxiety  ASSESSMENT AND PLAN:  Discussed the following assessment and plan:  Pure hypercholesterolemia - Plan: atorvastatin (LIPITOR) 80 MG tablet, Basic metabolic panel  CORONARY ATHEROSCLEROSIS NATIVE CORONARY ARTERY - Plan: labetalol (NORMODYNE) 200 MG tablet, nitroGLYCERIN (NITROSTAT) 0.4 MG SL tablet, Basic metabolic panel  Essential hypertension, benign - Plan: labetalol (NORMODYNE) 200 MG tablet, lisinopril (PRINIVIL,ZESTRIL) 40 MG tablet, Lipid Panel  -problems and medications reviewed -advised CPE in sept, regular CV exercise and a healthy diet -advised can stop niacin if wishes - advised follow up with cardiologist, then yearly with me after physical -FASTING labs today -Patient advised to return or notify a doctor immediately if symptoms worsen or persist or new concerns arise.  Patient Instructions  -SCHEDULE follow up appointment with your cardilolgist (call today to schedule follow up)  -We have ordered labs or studies at this visit. It can take up to 1-2 weeks for results and processing. We will contact you with instructions IF your results are abnormal. Normal results will be released to your [facility]. If you have not heard from [place] or can not find your results in [facility] in 2 weeks please contact our office.  -PLEASE SIGN UP FOR MYCHART TODAY   We recommend the following healthy lifestyle measures: - eat a healthy diet consisting of lots of vegetables, fruits, beans, nuts, seeds, healthy meats such as white chicken and fish and whole grains.  - avoid fried foods, fast food, processed foods, sodas, red meet and other fattening foods.  - get a least 150 minutes of aerobic exercise per week.   Follow up in: September or October for you physical exam      Lucretia Kern.

## 2013-07-24 NOTE — Progress Notes (Signed)
Pre visit review using our clinic review tool, if applicable. No additional management support is needed unless otherwise documented below in the visit note. 

## 2013-07-24 NOTE — Patient Instructions (Signed)
-  SCHEDULE follow up appointment with your cardilolgist (call today to schedule follow up)  -We have ordered labs or studies at this visit. It can take up to 1-2 weeks for results and processing. We will contact you with instructions IF your results are abnormal. Normal results will be released to your [facility]. If you have not heard from [place] or can not find your results in [facility] in 2 weeks please contact our office.  -PLEASE SIGN UP FOR MYCHART TODAY   We recommend the following healthy lifestyle measures: - eat a healthy diet consisting of lots of vegetables, fruits, beans, nuts, seeds, healthy meats such as white chicken and fish and whole grains.  - avoid fried foods, fast food, processed foods, sodas, red meet and other fattening foods.  - get a least 150 minutes of aerobic exercise per week.   Follow up in: September or October for you physical exam

## 2013-07-25 ENCOUNTER — Telehealth: Payer: Self-pay | Admitting: Family Medicine

## 2013-07-25 NOTE — Telephone Encounter (Signed)
Relevant patient education assigned to patient using Emmi. ° °

## 2013-07-26 ENCOUNTER — Other Ambulatory Visit: Payer: Self-pay | Admitting: *Deleted

## 2013-07-26 ENCOUNTER — Telehealth: Payer: Self-pay | Admitting: Family Medicine

## 2013-07-26 DIAGNOSIS — R7989 Other specified abnormal findings of blood chemistry: Secondary | ICD-10-CM

## 2013-07-26 DIAGNOSIS — R899 Unspecified abnormal finding in specimens from other organs, systems and tissues: Secondary | ICD-10-CM

## 2013-07-26 DIAGNOSIS — E875 Hyperkalemia: Secondary | ICD-10-CM

## 2013-07-26 NOTE — Telephone Encounter (Signed)
We will get back to them after rechecking the labs.

## 2013-07-26 NOTE — Telephone Encounter (Signed)
Pt son called to ask if his dad need to have blood work done or does he just need an appt  per his conversation with [employer]

## 2013-07-26 NOTE — Telephone Encounter (Signed)
I called the pts son [name] [name] and advised him the pt needs a lab appt only and orders were entered and appt scheduled for 7/17 at 4pm and to be sure the pt eats and drinks before this appt.   I informed [name] [name] Dr Maudie Mercury will review the labs and he will be called with the results and he agreed.  He stated the pt has never had these labs elevated and he questioned why and I informed him I am not sure and the message was forwarded to Dr Maudie Mercury.

## 2013-07-27 NOTE — Telephone Encounter (Signed)
I left a detailed message at the pts home number stating per Dr Maudie Mercury she is not sure why the labs are elevated and wants to repeat these and for [name] to call back with any questions.

## 2013-08-09 ENCOUNTER — Other Ambulatory Visit (HOSPITAL_COMMUNITY): Payer: Self-pay | Admitting: Family Medicine

## 2013-08-25 ENCOUNTER — Other Ambulatory Visit (INDEPENDENT_AMBULATORY_CARE_PROVIDER_SITE_OTHER): Payer: PRIVATE HEALTH INSURANCE

## 2013-08-25 DIAGNOSIS — R7989 Other specified abnormal findings of blood chemistry: Secondary | ICD-10-CM

## 2013-08-25 DIAGNOSIS — E875 Hyperkalemia: Secondary | ICD-10-CM

## 2013-08-25 LAB — CBC WITH DIFFERENTIAL/PLATELET
Basophils Absolute: 0 10*3/uL (ref 0.0–0.1)
Basophils Relative: 0.4 % (ref 0.0–3.0)
EOS PCT: 3.9 % (ref 0.0–5.0)
Eosinophils Absolute: 0.3 10*3/uL (ref 0.0–0.7)
HEMATOCRIT: 38.8 % — AB (ref 39.0–52.0)
Hemoglobin: 13.3 g/dL (ref 13.0–17.0)
LYMPHS ABS: 1.8 10*3/uL (ref 0.7–4.0)
Lymphocytes Relative: 27.3 % (ref 12.0–46.0)
MCHC: 34.2 g/dL (ref 30.0–36.0)
MCV: 83.9 fl (ref 78.0–100.0)
MONO ABS: 0.6 10*3/uL (ref 0.1–1.0)
Monocytes Relative: 9.3 % (ref 3.0–12.0)
Neutro Abs: 3.8 10*3/uL (ref 1.4–7.7)
Neutrophils Relative %: 59.1 % (ref 43.0–77.0)
PLATELETS: 215 10*3/uL (ref 150.0–400.0)
RBC: 4.62 Mil/uL (ref 4.22–5.81)
RDW: 13.1 % (ref 11.5–15.5)
WBC: 6.5 10*3/uL (ref 4.0–10.5)

## 2013-08-25 LAB — BASIC METABOLIC PANEL
BUN: 19 mg/dL (ref 6–23)
CHLORIDE: 104 meq/L (ref 96–112)
CO2: 21 mEq/L (ref 19–32)
Calcium: 9.2 mg/dL (ref 8.4–10.5)
Creatinine, Ser: 1 mg/dL (ref 0.4–1.5)
GFR: 77.46 mL/min (ref >60.00)
GLUCOSE: 84 mg/dL (ref 70–99)
POTASSIUM: 4 meq/L (ref 3.5–5.1)
Sodium: 135 mEq/L (ref 135–145)

## 2013-12-08 ENCOUNTER — Other Ambulatory Visit: Payer: Self-pay | Admitting: Family Medicine

## 2014-01-08 ENCOUNTER — Other Ambulatory Visit: Payer: Self-pay | Admitting: Family Medicine

## 2014-03-11 ENCOUNTER — Other Ambulatory Visit: Payer: Self-pay | Admitting: Family Medicine

## 2014-03-30 ENCOUNTER — Encounter: Payer: Self-pay | Admitting: Family Medicine

## 2014-03-30 ENCOUNTER — Ambulatory Visit (INDEPENDENT_AMBULATORY_CARE_PROVIDER_SITE_OTHER): Payer: No Typology Code available for payment source | Admitting: Family Medicine

## 2014-03-30 VITALS — BP 100/60 | HR 63 | Temp 97.5°F | Ht 66.0 in | Wt 158.0 lb

## 2014-03-30 DIAGNOSIS — E7800 Pure hypercholesterolemia, unspecified: Secondary | ICD-10-CM

## 2014-03-30 DIAGNOSIS — I2510 Atherosclerotic heart disease of native coronary artery without angina pectoris: Secondary | ICD-10-CM

## 2014-03-30 DIAGNOSIS — I10 Essential (primary) hypertension: Secondary | ICD-10-CM

## 2014-03-30 NOTE — Progress Notes (Signed)
HPI:  [name] [name] is a pleasant 64 yo m with poor compliance with care and follow up recommendations. He was to follow up with his cardiologist and schedule physical after his last visit 8 months ago. Here for refills.  HM: declines preventive measures, flu vaccine advised -   1)HTN: -medications include: labetolol 200mg  tid and lisinopril 40 mg daily -reports: doing great - exercises daily; eating healthy - lots of veggies, not bread -denies: CP, SOB, HA, vision changes, swelling  2)CAD: -hx inferior lat wall MI in 06/1998, normal myoview per cardiology notes in 02/2008, followed by Dr. Haroldine Laws in Cardiology (per notes he was to follow up closely with cardiologist after his last OV in 2013, but appears he did not) -medications include: ASA 81mg  qs, lipitor 80 mg qd, labetolol 200mg  tid, lisinopril 40 mg daily and niacin 500mg  daily (cardiologist ok with stopping niacin per notes, but pt opted to continue) - also has nitrostat -reports: reports doing great -denies: CP, SOB, DOE, Orthopnea, Palpitations, Swelling  3)HLD: -medications include: lipitor 80mg  daily and niacin 500mg  daily - wants to continue the niacin as feels good taking it -stable  ROS: See pertinent positives and negatives per HPI.  Past Medical History  Diagnosis Date  . Hx of coronary artery disease   . Hypertension   . Hyperlipidemia   . Myocardial infarction (lateral wall) May 2000    inferior lateral wall myocardial infarction  . ABNORMAL EKG 12/20/2008    Qualifier: Diagnosis of  By: Haroldine Laws, MD, Eileen Stanford     Past Surgical History  Procedure Laterality Date  . Angioplasty    . Cardiac catheterization       LAD 60-70% mid lesion, ramus 30% ostial lesion, circumflex was dominant with an occluded obtuse marginal branch which was angioplastied.  EF was 55%.     Family History  Problem Relation Age of Onset  . Heart disease Mother   . Hyperlipidemia Mother   . Hypertension Mother   .  Diabetes Mother   . Diabetes Brother     History   Social History  . Marital Status: Married    Spouse Name: N/A  . Number of Children: N/A  . Years of Education: N/A   Social History Main Topics  . Smoking status: Former Smoker    Types: Cigarettes    Quit date: 05/05/1991  . Smokeless tobacco: Never Used  . Alcohol Use: Yes     Comment: rare; 1-2 drinks  . Drug Use: No  . Sexual Activity: Not on file   Other Topics Concern  . 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  aspirin 81 MG tablet, Take 81 mg by mouth daily., Disp: , Rfl:  .  atorvastatin (LIPITOR) 80 MG tablet, Take 1 tablet (80 mg total) by mouth daily., Disp: 90 tablet, Rfl: 3 .  labetalol (NORMODYNE) 200 MG tablet, TAKE 1 TABLET 3 TIMES A DAY, Disp: 90 tablet, Rfl: 0 .  lisinopril (PRINIVIL,ZESTRIL) 40 MG tablet, TAKE 1 TABLET BY MOUTH EVERY DAY, Disp: 90 tablet, Rfl: 3 .  niacin (NIASPAN) 500 MG CR tablet, Take 1 tablet (500 mg total) by mouth at bedtime., Disp: 30  tablet, Rfl: 0 .  nitroGLYCERIN (NITROSTAT) 0.4 MG SL tablet, Place 1 tablet (0.4 mg total) under the tongue every 5 (five) minutes as needed for chest pain., Disp: 10 tablet, Rfl: 3  EXAM:  Filed Vitals:   03/30/14 1010  BP: 100/60  Pulse: 63  Temp: 97.5 F (36.4 C)    Body mass index is 25.51 kg/(m^2).  GENERAL: vitals reviewed and listed above, alert, oriented, appears well hydrated and in no acute distress  HEENT: atraumatic, conjunttiva clear, no obvious abnormalities on inspection of external nose and ears  NECK: no obvious masses on inspection  LUNGS: clear to auscultation bilaterally, no wheezes, rales or rhonchi, good air movement  CV: HRRR, no peripheral edema  MS: moves all extremities without noticeable  abnormality  PSYCH: pleasant and cooperative, no obvious depression or anxiety  ASSESSMENT AND PLAN:  Discussed the following assessment and plan:  Essential hypertension, benign  Pure hypercholesterolemia  Atherosclerosis of native coronary artery of native heart without angina pectoris  -his labs were great last visit -he is having no complaints and opted to do labs at follow up in the summer -Patient advised to return or notify a doctor immediately if symptoms worsen or persist or new concerns arise.  Patient Instructions  BEFORE YOU LEAVE: -schedule physical exam in June, come fasting to that appointment but drink plenty of water  We recommend the following healthy lifestyle measures: - eat a healthy diet consisting of lots of vegetables, fruits, beans, nuts, seeds, healthy meats such as white chicken and fish and whole grains.  - avoid fried foods, fast food, processed foods, sodas, red meet and other fattening foods.  - get a least 150 minutes of aerobic exercise per week.       Colin Benton R.

## 2014-03-30 NOTE — Progress Notes (Signed)
Pre visit review using our clinic review tool, if applicable. No additional management support is needed unless otherwise documented below in the visit note. 

## 2014-03-30 NOTE — Patient Instructions (Signed)
BEFORE YOU LEAVE: -schedule physical exam in June, come fasting to that appointment but drink plenty of water  We recommend the following healthy lifestyle measures: - eat a healthy diet consisting of lots of vegetables, fruits, beans, nuts, seeds, healthy meats such as white chicken and fish and whole grains.  - avoid fried foods, fast food, processed foods, sodas, red meet and other fattening foods.  - get a least 150 minutes of aerobic exercise per week.

## 2014-04-24 ENCOUNTER — Other Ambulatory Visit: Payer: Self-pay | Admitting: Family Medicine

## 2014-05-02 ENCOUNTER — Other Ambulatory Visit: Payer: Self-pay | Admitting: Family Medicine

## 2014-06-05 HISTORY — PX: REPLACEMENT TOTAL KNEE: SUR1224

## 2014-06-17 ENCOUNTER — Other Ambulatory Visit: Payer: Self-pay | Admitting: Family Medicine

## 2014-07-13 ENCOUNTER — Other Ambulatory Visit: Payer: Self-pay | Admitting: Family Medicine

## 2014-08-03 ENCOUNTER — Other Ambulatory Visit: Payer: Self-pay | Admitting: Family Medicine

## 2014-08-27 ENCOUNTER — Encounter: Payer: No Typology Code available for payment source | Admitting: Family Medicine

## 2014-09-01 ENCOUNTER — Other Ambulatory Visit: Payer: Self-pay | Admitting: Family Medicine

## 2014-09-10 ENCOUNTER — Ambulatory Visit (INDEPENDENT_AMBULATORY_CARE_PROVIDER_SITE_OTHER): Payer: No Typology Code available for payment source | Admitting: Family Medicine

## 2014-09-10 ENCOUNTER — Encounter: Payer: Self-pay | Admitting: Family Medicine

## 2014-09-10 VITALS — BP 120/74 | HR 87 | Temp 98.1°F | Ht 66.0 in | Wt 156.2 lb

## 2014-09-10 DIAGNOSIS — E7800 Pure hypercholesterolemia, unspecified: Secondary | ICD-10-CM

## 2014-09-10 DIAGNOSIS — Z0000 Encounter for general adult medical examination without abnormal findings: Secondary | ICD-10-CM | POA: Diagnosis not present

## 2014-09-10 DIAGNOSIS — I10 Essential (primary) hypertension: Secondary | ICD-10-CM | POA: Diagnosis not present

## 2014-09-10 DIAGNOSIS — I2510 Atherosclerotic heart disease of native coronary artery without angina pectoris: Secondary | ICD-10-CM

## 2014-09-10 DIAGNOSIS — M1712 Unilateral primary osteoarthritis, left knee: Secondary | ICD-10-CM

## 2014-09-10 LAB — LIPID PANEL
CHOLESTEROL: 108 mg/dL (ref 0–200)
HDL: 42.9 mg/dL (ref >39.00)
LDL CALC: 49 mg/dL (ref 0–99)
NONHDL: 64.81
Total CHOL/HDL Ratio: 3
Triglycerides: 81 mg/dL (ref 0.0–149.0)
VLDL: 16.2 mg/dL (ref 0.0–40.0)

## 2014-09-10 LAB — BASIC METABOLIC PANEL
BUN: 16 mg/dL (ref 6–23)
CHLORIDE: 103 meq/L (ref 96–112)
CO2: 25 mEq/L (ref 19–32)
Calcium: 9.9 mg/dL (ref 8.4–10.5)
Creatinine, Ser: 1.04 mg/dL (ref 0.40–1.50)
GFR: 76.35 mL/min (ref >60.00)
Glucose, Bld: 93 mg/dL (ref 70–99)
Potassium: 4.1 mEq/L (ref 3.5–5.1)
Sodium: 138 mEq/L (ref 135–145)

## 2014-09-10 NOTE — Patient Instructions (Signed)
BEFORE YOU LEAVE: -labs -follow up in 6 months  -We have ordered labs or studies at this visit. It can take up to 1-2 weeks for results and processing. We will contact you with instructions IF your results are abnormal. Normal results will be released to your [facility] If you have not heard from [place] or can not find your results in [facility] in 2 weeks please contact our office.  We recommend the following healthy lifestyle measures: - eat a healthy diet consisting of lots of vegetables, fruits, beans, nuts, seeds, healthy meats such as white chicken and fish and whole grains.  - avoid fried foods, fast food, processed foods, sodas, red meet and other fattening foods.  - get a least 150 minutes of aerobic exercise per week.

## 2014-09-10 NOTE — Progress Notes (Signed)
Pre visit review using our clinic review tool, if applicable. No additional management support is needed unless otherwise documented below in the visit note. 

## 2014-09-10 NOTE — Progress Notes (Signed)
HPI:  Here for CPE:  -Concerns and/or follow up today: none  1)HTN: -medications include: labetolol 200mg  tid and lisinopril 40 mg daily -reports: doing great - exercises daily; eating healthy - lots of veggies, not bread -denies: CP, SOB, HA, vision changes, swelling  2)CAD: -hx inferior lat wall MI in 06/1998, normal myoview per cardiology notes in 02/2008, followed by Dr. Haroldine Laws in Cardiology  -medications include: ASA 81mg  qs, lipitor 80 mg qd, labetolol 200mg  tid, lisinopril 40 mg daily and niacin 500mg  daily (cardiologist ok with stopping niacin per notes, but pt opted to continue) - also has nitrostat -reports: reports doing great -denies: CP, SOB, DOE, Orthopnea, Palpitations, Swelling  3)HLD: -medications include: lipitor 80mg  daily and niacin 500mg  daily - wants to continue the niacin as feels good taking it -stable  4) s/p L knee replacement: -doing well, exercising and doing great   -Diet: variety of foods, balance and well rounded  -Exercise: regular exercise  -Diabetes and Dyslipidemia Screening: FASTING for labs  -Hx of HTN: no  -Vaccines: UTD - advised needs flu vaccine yearly in the fall - we do not have yet here  -sexual activity: yes, male partner, no new partners  -wants STI testing, Hep C screening (if born 69-1965): addressed by assistant  -FH colon or prstate ca: see FH Last colon cancer screening: declined, agreed to stool cards, given Last prostate ca screening: n/a, discussed, declined  -Alcohol, Tobacco, drug use: see social history  Review of Systems - no fevers, unintentional weight loss, vision loss, hearing loss, chest pain, sob, hemoptysis, melena, hematochezia, hematuria, genital discharge, changing or concerning skin lesions, bleeding, bruising, loc, thoughts of self harm or SI  Past Medical History  Diagnosis Date  . Hx of coronary artery disease   . Hypertension   . Hyperlipidemia   . Myocardial infarction (lateral wall)  May 2000    inferior lateral wall myocardial infarction  . ABNORMAL EKG 12/20/2008    Qualifier: Diagnosis of  By: Haroldine Laws, MD, Eileen Stanford     Past Surgical History  Procedure Laterality Date  . Angioplasty    . Cardiac catheterization       LAD 60-70% mid lesion, ramus 30% ostial lesion, circumflex was dominant with an occluded obtuse marginal branch which was angioplastied.  EF was 55%.   . 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 Heart disease Mother   . Hyperlipidemia Mother   . Hypertension Mother   . Diabetes Mother   . Diabetes Brother     History   Social History  . Marital Status: Married    Spouse Name: N/A  . Number of Children: N/A  . Years of Education: N/A   Social History Main Topics  . Smoking status: Former Smoker    Types: Cigarettes    Quit date: 05/05/1991  . Smokeless tobacco: Never Used  . Alcohol Use: Yes     Comment: rare; 1-2 drinks  . Drug Use: No  . Sexual Activity: Not on file   Other Topics Concern  . 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  aspirin 81 MG tablet, Take 81 mg by mouth daily., Disp: , Rfl:  .  atorvastatin (LIPITOR) 80 MG tablet, TAKE 1 TABLET EVERY DAY, Disp: 90 tablet, Rfl: 0 .  labetalol (NORMODYNE) 200 MG tablet, TAKE 1 TABLET 3 TIMES A DAY, Disp: 90 tablet, Rfl: 1 .  lisinopril (PRINIVIL,ZESTRIL) 40 MG tablet, TAKE 1 TABLET BY MOUTH EVERY DAY, Disp: 90 tablet, Rfl: 0 .  niacin (NIASPAN) 500 MG CR tablet, Take 1 tablet (500 mg total) by mouth at bedtime., Disp: 30 tablet, Rfl: 0 .  nitroGLYCERIN (NITROSTAT) 0.4 MG SL tablet, Place 1 tablet (0.4 mg total) under the tongue every 5 (five) minutes as needed for chest pain., Disp: 10  tablet, Rfl: 3  EXAM:  Filed Vitals:   09/10/14 1000  BP: 120/74  Pulse: 87  Temp: 98.1 F (36.7 C)  TempSrc: Oral  Height: 5\' 6"  (1.676 m)  Weight: 156 lb 3.2 oz (70.852 kg)    Estimated body mass index is 25.22 kg/(m^2) as calculated from the following:   Height as of this encounter: 5\' 6"  (1.676 m).   Weight as of this encounter: 156 lb 3.2 oz (70.852 kg).  GENERAL: vitals reviewed and listed below, alert, oriented, appears well hydrated and in no acute distress  HEENT: head atraumatic, PERRLA, normal appearance of eyes, ears, nose and mouth. moist mucus membranes.  NECK: supple, no masses or lymphadenopathy  LUNGS: clear to auscultation bilaterally, no rales, rhonchi or wheeze  CV: HRRR, no peripheral edema or cyanosis, normal pedal pulses  ABDOMEN: bowel sounds normal, soft, non tender to palpation, no masses, no rebound or guarding  GU: declined  RECTAL: declined  SKIN: no rash or abnormal lesions  MS: normal gait, moves all extremities normally  NEURO: CN II-XII grossly intact, normal muscle strength and sensation to light touch on extremities  PSYCH: normal affect, pleasant and cooperative  ASSESSMENT AND PLAN:  Discussed the following assessment and plan:  There are no diagnoses linked to this encounter.  Visit for preventive health examination -Discussed and advised all [place] preventive services health task force level A and B recommendations for age, sex and risks.  -Advised at least 150 minutes of exercise per week and a healthy diet low in saturated fats and sweets and consisting of fresh fruits and vegetables, lean meats such as fish and white chicken and whole grains.  -labs, studies and vaccines per orders this encounter  Essential hypertension, benign - Plan: Basic metabolic panel  Atherosclerosis of native coronary artery of native heart without angina pectoris  Pure hypercholesterolemia - Plan: Lipid Panel  Osteoarthritis of left knee,  unspecified osteoarthritis type    -Discussed and advised all [place] preventive services health task force level A and B recommendations for age, sex and risks.  -Advised at least 150 minutes of exercise per week and a healthy diet low in saturated fats and sweets and consisting of fresh fruits and vegetables, lean meats such as fish and white chicken and whole grains.  -labs, studies and vaccines per orders this encounter   Patient advised to return to clinic immediately if symptoms worsen or persist or new concerns.  Patient Instructions  BEFORE YOU LEAVE: -labs -follow up in 6 months  -We have ordered labs or studies at this visit. It can take up to 1-2 weeks for results and processing. We will contact you with instructions IF your results are abnormal. Normal results will be released to your [facility]. If you have not heard from [place]  or can not find your results in [facility] in 2 weeks please contact our office.  We recommend the following healthy lifestyle measures: - eat a healthy diet consisting of lots of vegetables, fruits, beans, nuts, seeds, healthy meats such as white chicken and fish and whole grains.  - avoid fried foods, fast food, processed foods, sodas, red meet and other fattening foods.  - get a least 150 minutes of aerobic exercise per week.      Return in about 6 months (around 03/13/2015) for follow up.   [name] Benton R.

## 2014-10-10 ENCOUNTER — Other Ambulatory Visit: Payer: Self-pay | Admitting: Family Medicine

## 2014-10-28 ENCOUNTER — Other Ambulatory Visit: Payer: Self-pay | Admitting: Family Medicine

## 2014-11-28 ENCOUNTER — Other Ambulatory Visit: Payer: Self-pay | Admitting: Family Medicine

## 2015-01-25 ENCOUNTER — Other Ambulatory Visit: Payer: Self-pay | Admitting: Family Medicine

## 2015-02-26 ENCOUNTER — Other Ambulatory Visit: Payer: Self-pay | Admitting: Family Medicine

## 2015-03-19 ENCOUNTER — Other Ambulatory Visit: Payer: Self-pay | Admitting: Family Medicine

## 2015-05-30 ENCOUNTER — Other Ambulatory Visit: Payer: Self-pay | Admitting: Family Medicine

## 2015-07-26 ENCOUNTER — Ambulatory Visit (INDEPENDENT_AMBULATORY_CARE_PROVIDER_SITE_OTHER): Payer: No Typology Code available for payment source | Admitting: Family Medicine

## 2015-07-26 ENCOUNTER — Encounter: Payer: Self-pay | Admitting: Family Medicine

## 2015-07-26 VITALS — BP 100/60 | HR 72 | Temp 97.8°F | Ht 66.0 in | Wt 161.7 lb

## 2015-07-26 DIAGNOSIS — K6289 Other specified diseases of anus and rectum: Secondary | ICD-10-CM | POA: Diagnosis not present

## 2015-07-26 DIAGNOSIS — L988 Other specified disorders of the skin and subcutaneous tissue: Secondary | ICD-10-CM

## 2015-07-26 DIAGNOSIS — L089 Local infection of the skin and subcutaneous tissue, unspecified: Secondary | ICD-10-CM

## 2015-07-26 MED ORDER — CEPHALEXIN 500 MG PO CAPS: 500.0000 mg | ORAL_CAPSULE | Freq: Two times a day (BID) | ORAL | Status: DC

## 2015-07-26 MED ORDER — SULFAMETHOXAZOLE-TRIMETHOPRIM 800-160 MG PO TABS: 1.0000 | ORAL_TABLET | Freq: Two times a day (BID) | ORAL | Status: DC

## 2015-07-26 NOTE — Patient Instructions (Signed)
Take the antibiotics as instructed  Keep the area clean and dry  -We placed a referral for you as discussed to the general surgeon. It usually takes about a wekk to process and schedule this referral. If you have not heard from [place] regarding this appointment in 1 week please contact our office.  Please seek care immediately if worsening, fevers, feeling sick or other concerns

## 2015-07-26 NOTE — Progress Notes (Signed)
Pre visit review using our clinic review tool, if applicable. No additional management support is needed unless otherwise documented below in the visit note. 

## 2015-07-26 NOTE — Progress Notes (Signed)
HPI:  Rectal pain: Started 10 days ago Pain in rectal area Reports has been draining green colored discharge Denies: fevers, bleeding, malaise, constipation, diarrhea He has not tried any treatments  ROS: See pertinent positives and negatives per HPI.  Past Medical History  Diagnosis Date  . Hx of coronary artery disease   . Hypertension   . Hyperlipidemia   . Myocardial infarction (lateral wall) [facility]) May 2000    inferior lateral wall myocardial infarction  . ABNORMAL EKG 12/20/2008    Qualifier: Diagnosis of  By: Haroldine Laws, MD, Eileen Stanford     Past Surgical History  Procedure Laterality Date  . Angioplasty    . Cardiac catheterization       LAD 60-70% mid lesion, ramus 30% ostial lesion, circumflex was dominant with an occluded obtuse marginal branch which was angioplastied.  EF was 55%.   . 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 Heart disease Mother   . Hyperlipidemia Mother   . Hypertension Mother   . Diabetes Mother   . Diabetes Brother     Social History   Social History  . Marital Status: Married    Spouse Name: N/A  . Number of Children: N/A  . Years of Education: N/A   Social History Main Topics  . Smoking status: Former Smoker    Types: Cigarettes    Quit date: 05/05/1991  . Smokeless tobacco: Never Used  . Alcohol Use: Yes     Comment: rare; 1-2 drinks  . Drug Use: No  . Sexual Activity: Not Asked   Other Topics Concern  . 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  aspirin 81 MG tablet, Take 81 mg by mouth daily., Disp: , Rfl:  .  atorvastatin (LIPITOR) 80 MG tablet, TAKE 1 TABLET EVERY DAY, Disp: 90 tablet, Rfl: 0 .  labetalol (NORMODYNE) 200 MG  tablet, TAKE 1 TABLET 3 TIMES A DAY, Disp: 90 tablet, Rfl: 3 .  lisinopril (PRINIVIL,ZESTRIL) 40 MG tablet, TAKE 1 TABLET BY MOUTH EVERY DAY, Disp: 90 tablet, Rfl: 0 .  niacin (NIASPAN) 500 MG CR tablet, Take 1 tablet (500 mg total) by mouth at bedtime., Disp: 30 tablet, Rfl: 0 .  nitroGLYCERIN (NITROSTAT) 0.4 MG SL tablet, Place 1 tablet (0.4 mg total) under the tongue every 5 (five) minutes as needed for chest pain., Disp: 10 tablet, Rfl: 3 .  cephALEXin (KEFLEX) 500 MG capsule, Take 1 capsule (500 mg total) by mouth 2 (two) times daily., Disp: 10 capsule, Rfl: 0 .  sulfamethoxazole-trimethoprim (BACTRIM DS) 800-160 MG tablet, Take 1 tablet by mouth 2 (two) times daily., Disp: 10 tablet, Rfl: 0  EXAM:  Filed Vitals:   07/26/15 0936  BP: 100/60  Pulse: 72  Temp: 97.8 F (36.6 C)    Body mass index is 26.11 kg/(m^2).  GENERAL: vitals reviewed and listed above, alert, oriented, appears well hydrated and in no acute distress  HEENT: atraumatic, conjunttiva clear, no obvious abnormalities on inspection of external nose and ears  NECK: no obvious masses on inspection  LUNGS: clear to auscultation bilaterally,  no wheezes, rales or rhonchi, good air movement  CV: HRRR, no peripheral edema  SKIN: erythematous, indurated, mildly macerated skin in 4x5 cm area in gluteal cleft on R with ? Draining tract or fistula, serosanguinous drainage  MS: moves all extremities without noticeable abnormality  PSYCH: pleasant and cooperative, no obvious depression or anxiety  ASSESSMENT AND PLAN:  Discussed the following assessment and plan:  Skin infection - Plan: Ambulatory referral to General Surgery  Rectal pain - Plan: Ambulatory referral to General Surgery  Possible fistula - Plan: Ambulatory referral to General Surgery  -we discussed possible serious and likely etiologies, workup and treatment, treatment risks and return precautions -after this discussion, Madyx opted for surgical  evaluation - referral sent, abx in interim, return and emergency precuations -of course, we advised Victormanuel  to return or notify a doctor immediately if symptoms worsen or persist or new concerns arise.  -Patient advised to return or notify a doctor immediately if symptoms worsen or persist or new concerns arise.  Patient Instructions  Take the antibiotics as instructed  Keep the area clean and dry  -We placed a referral for you as discussed to the general surgeon. It usually takes about a wekk to process and schedule this referral. If you have not heard from [place] regarding this appointment in 1 week please contact our office.  Please seek care immediately if worsening, fevers, feeling sick or other concerns        Keyli Duross R.

## 2015-09-07 ENCOUNTER — Other Ambulatory Visit: Payer: Self-pay | Admitting: Family Medicine

## 2015-09-08 ENCOUNTER — Other Ambulatory Visit: Payer: Self-pay | Admitting: Family Medicine

## 2015-09-27 ENCOUNTER — Other Ambulatory Visit: Payer: Self-pay | Admitting: Family Medicine

## 2015-10-19 ENCOUNTER — Other Ambulatory Visit: Payer: Self-pay | Admitting: Family Medicine

## 2015-10-19 DIAGNOSIS — I2510 Atherosclerotic heart disease of native coronary artery without angina pectoris: Secondary | ICD-10-CM

## 2015-11-19 ENCOUNTER — Other Ambulatory Visit: Payer: Self-pay | Admitting: Family Medicine

## 2015-11-20 LAB — CMP12+LP+TP+TSH+6AC+PSA+CBC…
ALBUMIN: 4.6 g/dL (ref 3.6–4.8)
ALT: 35 IU/L (ref 0–44)
AST: 29 IU/L (ref 0–40)
Albumin/Globulin Ratio: 1.8 (ref 1.2–2.2)
Alkaline Phosphatase: 82 IU/L (ref 39–117)
BILIRUBIN TOTAL: 0.6 mg/dL (ref 0.0–1.2)
BUN / CREAT RATIO: 20 (ref 10–24)
BUN: 24 mg/dL (ref 8–27)
Basophils Absolute: 0 10*3/uL (ref 0.0–0.2)
Basos: 0 %
CALCIUM: 9.4 mg/dL (ref 8.6–10.2)
CHLORIDE: 96 mmol/L (ref 96–106)
CREATININE: 1.21 mg/dL (ref 0.76–1.27)
Chol/HDL Ratio: 3 ratio units (ref 0.0–5.0)
Cholesterol, Total: 118 mg/dL (ref 100–199)
EOS (ABSOLUTE): 0.2 10*3/uL (ref 0.0–0.4)
EOS: 2 %
Free Thyroxine Index: 2 (ref 1.2–4.9)
GFR calc Af Amer: 72 mL/min/{1.73_m2} (ref >59)
GFR, EST NON AFRICAN AMERICAN: 62 mL/min/{1.73_m2} (ref >59)
GGT: 43 IU/L (ref 0–65)
GLUCOSE: 96 mg/dL (ref 65–99)
Globulin, Total: 2.5 g/dL (ref 1.5–4.5)
HDL: 40 mg/dL (ref >39)
HEMOGLOBIN: 13.9 g/dL (ref 12.6–17.7)
Hematocrit: 41 % (ref 37.5–51.0)
Immature Grans (Abs): 0 10*3/uL (ref 0.0–0.1)
Immature Granulocytes: 0 %
Iron: 104 ug/dL (ref 38–169)
LDH: 227 IU/L — ABNORMAL HIGH (ref 121–224)
LDL Calculated: 56 mg/dL (ref 0–99)
LYMPHS ABS: 1.7 10*3/uL (ref 0.7–3.1)
Lymphs: 18 %
MCH: 28.6 pg (ref 26.6–33.0)
MCHC: 33.9 g/dL (ref 31.5–35.7)
MCV: 84 fL (ref 79–97)
MONOCYTES: 9 %
Monocytes Absolute: 0.9 10*3/uL (ref 0.1–0.9)
NEUTROS ABS: 6.8 10*3/uL (ref 1.4–7.0)
Neutrophils: 71 %
PHOSPHORUS: 4.2 mg/dL (ref 2.5–4.5)
POTASSIUM: 4.6 mmol/L (ref 3.5–5.2)
Platelets: 242 10*3/uL (ref 150–379)
Prostate Specific Ag, Serum: 1 ng/mL (ref 0.0–4.0)
RBC: 4.86 x10E6/uL (ref 4.14–5.80)
RDW: 13.4 % (ref 12.3–15.4)
SODIUM: 134 mmol/L (ref 134–144)
T3 Uptake Ratio: 26 % (ref 24–39)
T4, Total: 7.8 ug/dL (ref 4.5–12.0)
TSH: 1.61 u[IU]/mL (ref 0.450–4.500)
Total Protein: 7.1 g/dL (ref 6.0–8.5)
Triglycerides: 111 mg/dL (ref 0–149)
URIC ACID: 7.1 mg/dL (ref 3.7–8.6)
VLDL CHOLESTEROL CAL: 22 mg/dL (ref 5–40)
WBC: 9.5 10*3/uL (ref 3.4–10.8)

## 2015-11-20 LAB — HGB A1C W/O EAG: HEMOGLOBIN A1C: 5.6 % (ref 4.8–5.6)

## 2015-12-08 ENCOUNTER — Other Ambulatory Visit: Payer: Self-pay | Admitting: Family Medicine

## 2015-12-21 ENCOUNTER — Other Ambulatory Visit: Payer: Self-pay | Admitting: Family Medicine

## 2016-01-13 ENCOUNTER — Other Ambulatory Visit: Payer: Self-pay | Admitting: Family Medicine

## 2016-01-23 ENCOUNTER — Other Ambulatory Visit: Payer: Self-pay | Admitting: Family Medicine

## 2016-02-11 ENCOUNTER — Other Ambulatory Visit: Payer: Self-pay | Admitting: Family Medicine

## 2016-02-16 NOTE — Progress Notes (Signed)
HPI:  [name] [name] is a pleasant 66 yo with a PMH significant for HTN, HLD, CAD, hx of MI here for follow up. He has been doing well. Reports he exercises daily on stationary bike and is eating healthy. No complaints. Denies CP, SOB, DOE, swelling or palpitations. Had labs a few months ago which were reviewed and normal. He declined pneumonia vaccine and opts to do labs at his preventive visit.  1)HTN: -medications include: labetolol 200mg  tid and lisinopril 40 mg daily  2)CAD: -hx inferior lat wall MI in 06/1998, normal myoview per cardiology notes in 02/2008, followed by Dr. Haroldine Laws in Cardiology  -medications include: ASA 81mg  qs, lipitor 80 mg qd, labetolol 200mg  tid, lisinopril 40 mg daily and niacin 500mg  daily (cardiologist ok with stopping niacin per notes, but pt opted to continue) - also has nitrostat  3)HLD: -medications include: lipitor 80mg  daily and niacin 500mg  daily - wants to continue the niacin as feels good taking it -stable  4) s/p L knee replacement: -doing well, exercising and doing great  ROS: See pertinent positives and negatives per HPI.  Past Medical History:  Diagnosis Date  . ABNORMAL EKG 12/20/2008   Qualifier: Diagnosis of  By: Haroldine Laws, MD, Eileen Stanford Hx of coronary artery disease   . Hyperlipidemia   . Hypertension   . Myocardial infarction (lateral wall) [facility]) May 2000   inferior lateral wall myocardial infarction    Past Surgical History:  Procedure Laterality Date  . ANGIOPLASTY    . CARDIAC CATHETERIZATION      LAD 60-70% mid lesion, ramus 30% ostial lesion, circumflex was dominant with an occluded obtuse marginal branch which was angioplastied.  EF was 55%.   . 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 Heart disease Mother   . Hyperlipidemia Mother   . Hypertension Mother   . Diabetes Mother   . Diabetes Brother     Social History    Social History  . Marital status: Married    Spouse name: N/A  . Number of children: N/A  . Years of education: N/A   Social History Main Topics  . Smoking status: Former Smoker    Types: Cigarettes    Quit date: 05/05/1991  . Smokeless tobacco: Never Used  . Alcohol use Yes     Comment: rare; 1-2 drinks  . Drug use: No  . Sexual activity: Not Asked   Other Topics Concern  . 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  aspirin 81 MG tablet, Take 81 mg by mouth daily., Disp: , Rfl:  .  atorvastatin (LIPITOR) 80 MG tablet, TAKE 1 TABLET EVERY DAY *NEED APPT*, Disp: 30 tablet, Rfl: 0 .  labetalol (NORMODYNE) 200 MG tablet, TAKE 1 TABLET 3 TIMES A DAY, Disp: 90 tablet, Rfl: 3 .  lisinopril (PRINIVIL,ZESTRIL) 40 MG tablet, TAKE 1 TABLET BY MOUTH EVERY DAY, Disp: 30 tablet, Rfl: 0 .  niacin (NIASPAN) 500 MG CR tablet, Take 1 tablet (500 mg total) by mouth at bedtime., Disp: 30 tablet, Rfl: 0 .  NITROSTAT 0.4 MG SL  tablet, PLACE 1 TABLET (0.4 MG TOTAL) UNDER THE TONGUE EVERY 5 (FIVE) MINUTES AS NEEDED FOR CHEST PAIN., Disp: 25 tablet, Rfl: 0  EXAM:  Vitals:   02/17/16 0911  BP: 116/80  Temp: 97.7 F (36.5 C)    Body mass index is 27.33 kg/m.  GENERAL: vitals reviewed and listed above, alert, oriented, appears well hydrated and in no acute distress  HEENT: atraumatic, conjunttiva clear, no obvious abnormalities on inspection of external nose and ears  NECK: no obvious masses on inspection  LUNGS: clear to auscultation bilaterally, no wheezes, rales or rhonchi, good air movement  CV: HRRR, no peripheral edema  MS: moves all extremities without noticeable abnormality  PSYCH: pleasant and cooperative, no obvious depression or anxiety  ASSESSMENT AND  PLAN:  Discussed the following assessment and plan:  Essential hypertension, benign  Pure hypercholesterolemia  Atherosclerosis of native coronary artery of native heart without angina pectoris  -doing well, declined pna vaccine -encouraged continued exercise and healthy diet -will plan labs at preventive visit per his preference -Patient advised to return or notify a doctor immediately if symptoms worsen or persist or new concerns arise.  Patient Instructions  BEFORE YOU LEAVE: -follow up: 1) Welcome to Medicare Exam with Dr. Maudie Mercury in 2 months - come fasting if possible for labs the same day   We recommend the following healthy lifestyle for LIFE: 1) Small portions.   Tip: eat off of a salad plate instead of a dinner plate.  Tip: It is ok to feel hungry after a meal   Tip: if you need more or a snack choose fruits, veggies and/or a handful of nuts or seeds.  2) Eat a healthy clean diet.  * Tip: Avoid (less then 1 serving per week): processed foods, sweets, sweetened drinks, white starches (rice, flour, bread, potatoes, pasta, etc), red meat, fast foods, butter  *Tip: CHOOSE instead   * 5-9 servings per day of fresh or frozen fruits and vegetables (but not corn, potatoes, bananas, canned or dried fruit)   *nuts and seeds, beans   *olives and olive oil   *small portions of lean meats such as fish and white chicken    *small portions of whole grains  3)Get at least 150 minutes of sweaty aerobic exercise per week.  4)Reduce stress - consider counseling, meditation and relaxation to balance other aspects of your life.     Colin Benton R., DO

## 2016-02-17 ENCOUNTER — Ambulatory Visit (INDEPENDENT_AMBULATORY_CARE_PROVIDER_SITE_OTHER): Payer: No Typology Code available for payment source | Admitting: Family Medicine

## 2016-02-17 ENCOUNTER — Encounter: Payer: Self-pay | Admitting: Family Medicine

## 2016-02-17 VITALS — BP 116/80 | Temp 97.7°F | Ht 66.0 in | Wt 169.3 lb

## 2016-02-17 DIAGNOSIS — I10 Essential (primary) hypertension: Secondary | ICD-10-CM | POA: Diagnosis not present

## 2016-02-17 DIAGNOSIS — E7800 Pure hypercholesterolemia, unspecified: Secondary | ICD-10-CM

## 2016-02-17 DIAGNOSIS — I2510 Atherosclerotic heart disease of native coronary artery without angina pectoris: Secondary | ICD-10-CM | POA: Diagnosis not present

## 2016-02-17 NOTE — Progress Notes (Signed)
Pre visit review using our clinic review tool, if applicable. No additional management support is needed unless otherwise documented below in the visit note. 

## 2016-02-17 NOTE — Patient Instructions (Signed)
BEFORE YOU LEAVE: -follow up: 1) Welcome to Medicare Exam with Dr. Maudie Mercury in 2 months - come fasting if possible for labs the same day   We recommend the following healthy lifestyle for LIFE: 1) Small portions.   Tip: eat off of a salad plate instead of a dinner plate.  Tip: It is ok to feel hungry after a meal   Tip: if you need more or a snack choose fruits, veggies and/or a handful of nuts or seeds.  2) Eat a healthy clean diet.  * Tip: Avoid (less then 1 serving per week): processed foods, sweets, sweetened drinks, white starches (rice, flour, bread, potatoes, pasta, etc), red meat, fast foods, butter  *Tip: CHOOSE instead   * 5-9 servings per day of fresh or frozen fruits and vegetables (but not corn, potatoes, bananas, canned or dried fruit)   *nuts and seeds, beans   *olives and olive oil   *small portions of lean meats such as fish and white chicken    *small portions of whole grains  3)Get at least 150 minutes of sweaty aerobic exercise per week.  4)Reduce stress - consider counseling, meditation and relaxation to balance other aspects of your life.

## 2016-02-23 ENCOUNTER — Other Ambulatory Visit: Payer: Self-pay | Admitting: Family Medicine

## 2016-02-26 ENCOUNTER — Other Ambulatory Visit: Payer: Self-pay | Admitting: Family Medicine

## 2016-03-09 ENCOUNTER — Other Ambulatory Visit: Payer: Self-pay | Admitting: Family Medicine

## 2016-03-10 ENCOUNTER — Other Ambulatory Visit: Payer: Self-pay | Admitting: Family Medicine

## 2016-04-25 ENCOUNTER — Other Ambulatory Visit: Payer: Self-pay | Admitting: Family Medicine

## 2016-06-27 ENCOUNTER — Other Ambulatory Visit: Payer: Self-pay | Admitting: Family Medicine

## 2016-07-11 ENCOUNTER — Other Ambulatory Visit: Payer: Self-pay | Admitting: Family Medicine

## 2016-07-12 ENCOUNTER — Other Ambulatory Visit: Payer: Self-pay | Admitting: Family Medicine

## 2016-07-19 ENCOUNTER — Other Ambulatory Visit: Payer: Self-pay | Admitting: Family Medicine

## 2016-07-19 DIAGNOSIS — I2510 Atherosclerotic heart disease of native coronary artery without angina pectoris: Secondary | ICD-10-CM

## 2016-09-19 ENCOUNTER — Other Ambulatory Visit: Payer: Self-pay | Admitting: Family Medicine

## 2016-10-20 ENCOUNTER — Ambulatory Visit: Payer: Self-pay | Admitting: *Deleted

## 2016-10-20 VITALS — BP 129/80 | HR 67 | Ht 66.0 in | Wt 167.0 lb

## 2016-10-20 DIAGNOSIS — Z0000 Encounter for general adult medical examination without abnormal findings: Secondary | ICD-10-CM

## 2016-10-20 NOTE — Progress Notes (Signed)
[employer] insurance premium discount evaluation: Labs Drawn. Replacements ROI form signed. Tobacco Free Attestation form signed.  Forms placed in paper chart.  Okay to route results to PCP.

## 2016-10-21 LAB — CMP12+LP+TP+TSH+6AC+PSA+CBC…
ALBUMIN: 4.8 g/dL (ref 3.6–4.8)
ALT: 39 IU/L (ref 0–44)
AST: 34 IU/L (ref 0–40)
Albumin/Globulin Ratio: 1.8 (ref 1.2–2.2)
Alkaline Phosphatase: 82 IU/L (ref 39–117)
BASOS ABS: 0 10*3/uL (ref 0.0–0.2)
BUN/Creatinine Ratio: 19 (ref 10–24)
BUN: 23 mg/dL (ref 8–27)
Basos: 0 %
Bilirubin Total: 0.8 mg/dL (ref 0.0–1.2)
CALCIUM: 9.5 mg/dL (ref 8.6–10.2)
CHOLESTEROL TOTAL: 133 mg/dL (ref 100–199)
Chloride: 101 mmol/L (ref 96–106)
Chol/HDL Ratio: 3 ratio (ref 0.0–5.0)
Creatinine, Ser: 1.22 mg/dL (ref 0.76–1.27)
EOS (ABSOLUTE): 0.2 10*3/uL (ref 0.0–0.4)
Eos: 3 %
Estimated CHD Risk: <0.5 times avg. (ref 0.0–1.0)
FREE THYROXINE INDEX: 1.8 (ref 1.2–4.9)
GFR calc Af Amer: 71 mL/min/{1.73_m2} (ref >59)
GFR calc non Af Amer: 61 mL/min/{1.73_m2} (ref >59)
GGT: 40 IU/L (ref 0–65)
GLOBULIN, TOTAL: 2.6 g/dL (ref 1.5–4.5)
Glucose: 101 mg/dL — ABNORMAL HIGH (ref 65–99)
HDL: 45 mg/dL (ref >39)
Hematocrit: 46.1 % (ref 37.5–51.0)
Hemoglobin: 15.4 g/dL (ref 13.0–17.7)
Immature Grans (Abs): 0 10*3/uL (ref 0.0–0.1)
Immature Granulocytes: 0 %
Iron: 109 ug/dL (ref 38–169)
LDH: 243 IU/L — ABNORMAL HIGH (ref 121–224)
LDL Calculated: 69 mg/dL (ref 0–99)
LYMPHS ABS: 1.5 10*3/uL (ref 0.7–3.1)
Lymphs: 20 %
MCH: 28.3 pg (ref 26.6–33.0)
MCHC: 33.4 g/dL (ref 31.5–35.7)
MCV: 85 fL (ref 79–97)
MONOS ABS: 0.6 10*3/uL (ref 0.1–0.9)
Monocytes: 8 %
NEUTROS PCT: 69 %
Neutrophils Absolute: 5.4 10*3/uL (ref 1.4–7.0)
PLATELETS: 215 10*3/uL (ref 150–379)
PROSTATE SPECIFIC AG, SERUM: 1 ng/mL (ref 0.0–4.0)
Phosphorus: 4 mg/dL (ref 2.5–4.5)
Potassium: 4.9 mmol/L (ref 3.5–5.2)
RBC: 5.44 x10E6/uL (ref 4.14–5.80)
RDW: 13.5 % (ref 12.3–15.4)
Sodium: 140 mmol/L (ref 134–144)
T3 Uptake Ratio: 23 % — ABNORMAL LOW (ref 24–39)
T4 TOTAL: 7.9 ug/dL (ref 4.5–12.0)
TRIGLYCERIDES: 97 mg/dL (ref 0–149)
TSH: 1.27 u[IU]/mL (ref 0.450–4.500)
Total Protein: 7.4 g/dL (ref 6.0–8.5)
Uric Acid: 6.7 mg/dL (ref 3.7–8.6)
VLDL Cholesterol Cal: 19 mg/dL (ref 5–40)
WBC: 7.8 10*3/uL (ref 3.4–10.8)

## 2016-10-21 LAB — HGB A1C W/O EAG: Hgb A1c MFr Bld: 5.6 % (ref 4.8–5.6)

## 2016-10-23 NOTE — Progress Notes (Addendum)
Results reviewed with pt. LDH elevated. Liver enzymes normal. Glucoses slightly elevated and A1c high-normal. Diet and exercise recommendations for A1c and wt management (BMI 26, 10# wt loss) discussed. Other labs unremarkable. Routine f/u with pcp. Copy provided to pt. Results routed to pcp. No further questions/concerns.

## 2016-10-26 ENCOUNTER — Telehealth: Payer: Self-pay | Admitting: *Deleted

## 2016-10-26 NOTE — Telephone Encounter (Signed)
Unable to leave a message at the pts home number due to the voicemail being full.

## 2016-10-26 NOTE — Telephone Encounter (Signed)
-----   Message from Lucretia Kern, DO sent at 10/25/2016  9:24 AM EDT ----- Hildred Alamin, Thank you for forwarding. I would like to ask what guidelines you are following that resulted in ordering these lab panels for screening in an asymptomatic patient?   I will forward this to my assistant to request pt come in to review abnormal findings and determine what steps to take next. However, if you wish to continue to order large panels of labs that are not indicated by current standard guidelines, then I would request that you are equipped to make recommendations regarding the abnormal results you find future. Thank you. Dr. Horton Finer,  Could you contact pt to schedule appt to review lab results from insurance company. Thanks.  ----- Message ----- From: Beckie Busing, RN Sent: 10/23/2016  12:17 PM To: Lucretia Kern, DO  Please see attached for Mickie's annual occ health labs drawn for insurance purposes. These have been reviewed with the patient in our clinic. Please contact Mickie with any recommendations or concerns. Thank you! Cheri Guppy RN

## 2016-10-27 ENCOUNTER — Encounter: Payer: Self-pay | Admitting: *Deleted

## 2016-10-27 NOTE — Telephone Encounter (Signed)
Unable to leave a message at the pts home number due to the voicemail being full. Letter mailed to the pts home address to call the office for an appt to review lab results from his insurance company.

## 2016-10-29 ENCOUNTER — Encounter: Payer: Self-pay | Admitting: Family Medicine

## 2016-12-01 ENCOUNTER — Other Ambulatory Visit: Payer: Self-pay | Admitting: Family Medicine

## 2016-12-10 NOTE — Progress Notes (Signed)
HPI:  [name] [name] is a pleasant 66 y.o. here for follow up. Chronic medical problems summarized below were reviewed for changes and stability. He had some sort of physical elsewhere and a large panel of labs was done in September. Apparently LDH was elevated a little - unsure why this was drawn. Mild Hyperglycemia per notes in Epic. He reports he feels great. No complaints. No unexplained weight loss or bowel changes, shortness of breath or any other complaints. He does eat a lot of fruit. Tries to eat healthy otherwise and gets regular exercise. Denies CP, SOB, DOE, treatment intolerance or new symptoms. Due for colon cancer screening, flu vaccine, pna vaccine, hep c screening per Epic He thinks he had a colonoscopy last year - reports we referred him somewhere and they did a procedure he thought was a colonosocpy. I do not have a copy of this and I am not sure that he had this as it appears he declined and requested to do stool cards instead.  1)HTN: -medications include: labetolol 200mg  tid and lisinopril 40 mg daily  2)CAD: -hx inferior lat wall MI in 06/1998, normal myoview per cardiology notes in 02/2008, followed by Dr. Haroldine Laws in Cardiology  -medications include: ASA 81mg  qs, lipitor 80 mg qd, labetolol 200mg  tid, lisinopril 40 mg daily and niacin 500mg  daily (cardiologist ok with stopping niacin per notes, but pt opted to continue) - also has nitrostat  3)HLD: -medications include: lipitor 80mg  daily and niacin 500mg  daily - wants to continue the niacin as feels good taking it -stable  4) s/p L knee replacement: -doing well, exercising and doing great  ROS: See pertinent positives and negatives per HPI.  Past Medical History:  Diagnosis Date  . ABNORMAL EKG 12/20/2008   Qualifier: Diagnosis of  By: Haroldine Laws, MD, Eileen Stanford Hx of coronary artery disease   . Hyperlipidemia   . Hypertension   . Myocardial infarction (lateral wall) [facility]) May 2000   inferior  lateral wall myocardial infarction    Past Surgical History:  Procedure Laterality Date  . ANGIOPLASTY    . CARDIAC CATHETERIZATION      LAD 60-70% mid lesion, ramus 30% ostial lesion, circumflex was dominant with an occluded obtuse marginal branch which was angioplastied.  EF was 55%.   . 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 Heart disease Mother   . Hyperlipidemia Mother   . Hypertension Mother   . Diabetes Mother   . Diabetes Brother     Social History   Social History  . Marital status: Married    Spouse name: N/A  . Number of children: N/A  . Years of education: N/A   Social History Main Topics  . Smoking status: Former Smoker    Types: Cigarettes    Quit date: 05/05/1991  . Smokeless tobacco: Never Used  . Alcohol use Yes     Comment: rare; 1-2 drinks  . Drug use: No  . Sexual activity: Not Asked   Other Topics Concern  . 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  aspirin  81 MG chewable tablet, Chew by mouth daily., Disp: , Rfl:  .  atorvastatin (LIPITOR) 80 MG tablet, TAKE 1 TABLET EVERY DAY *NEED APPT*, Disp: 30 tablet, Rfl: 0 .  labetalol (NORMODYNE) 200 MG tablet, TAKE 1 TABLET BY MOUTH THREE TIMES A DAY, Disp: 90 tablet, Rfl: 1 .  lisinopril (PRINIVIL,ZESTRIL) 40 MG tablet, TAKE 1 TABLET BY MOUTH EVERY DAY, Disp: 90 tablet, Rfl: 0 .  Multiple Vitamin (MULTI-VITAMINS) TABS, Take by mouth., Disp: , Rfl:  .  niacin (NIASPAN) 500 MG CR tablet, Take 1 tablet (500 mg total) by mouth at bedtime., Disp: 30 tablet, Rfl: 0 .  nitroGLYCERIN (NITROSTAT) 0.4 MG SL tablet, PLACE 1 TABLET (0.4 MG TOTAL) UNDER THE TONGUE EVERY 5 (FIVE) MINUTES AS NEEDED FOR CHEST PAIN., Disp: 25 tablet,  Rfl: 0  EXAM:  Vitals:   12/11/16 0906  BP: 120/80  Pulse: 71  Temp: 97.9 F (36.6 C)    Body mass index is 27.28 kg/m.  GENERAL: vitals reviewed and listed above, alert, oriented, appears well hydrated and in no acute distress  HEENT: atraumatic, conjunttiva clear, no obvious abnormalities on inspection of external nose and ears  NECK: no obvious masses on inspection  LUNGS: clear to auscultation bilaterally, no wheezes, rales or rhonchi, good air movement  CV: HRRR, no peripheral edema  MS: moves all extremities without noticeable abnormality  PSYCH: pleasant and cooperative, no obvious depression or anxiety  ASSESSMENT AND PLAN:  Discussed the following assessment and plan:  Essential hypertension, benign  Hyperlipidemia, unspecified hyperlipidemia type  Atherosclerosis of native coronary artery of native heart without angina pectoris  Elevated LDH - Plan: Lactate Dehydrogenase  Colon cancer screening  Encounter for hepatitis C virus screening test for high risk patient - Plan: Hepatitis C antibody  Need for immunization against influenza - Plan: Flu vaccine HIGH DOSE PF (Fluzone High dose)  -advised healthy low sugar diet and regular exercise -assistant check with surgeon we referred him to regarding colon cancer screening - only anoscopy was done, advised to do colon cancer screening and options, sheena is to check on coverage of cologard and then Wendie Simmer was advised to order or refer  -advised of vaccines due -for the LDH that was elevated elsewhere will recheck, if remains elevated will refer to hematology - did discuss that this is not a lab normally done with screening labs  -Patient advised to return or notify a doctor immediately if symptoms worsen or persist or new concerns arise.  Patient Instructions  BEFORE YOU LEAVE: -Flu shot -labs -Wendie Simmer, please contact [facility] to see what procedures they did on him - he thinks he had a  colonoscopy, but I don't know where he would've had this other than possibly the there last year. If he did not have a colonoscopy there, please have Sheena check with his insurance to see if they cover the Cologuard test. Then either do the Cologuard test or refer him to GI for colonoscopy per his wishes. -follow up: 3-4 months  We have ordered labs or studies at this visit. It can take up to 1-2 weeks for results and processing. IF results require follow up or explanation, we will call you with instructions. Clinically stable results will be released to your [facility]. If you have not heard from [place] or cannot find your results in [facility] in 2 weeks please contact our office at [phone].  If you are not yet signed up for [facility], please consider signing up.  We recommend the following healthy lifestyle for LIFE: 1) Small portions.   Tip: eat off of a salad plate instead of a dinner plate.  Tip: It is ok to feel hungry after a meal - that likely means you ate an appropriate portion.  Tip: if you need more or a snack choose fruits, veggies and/or a handful of nuts or seeds.  2) Eat a healthy clean diet.   TRY TO EAT: -at least 5-7 servings of low sugar vegetables per day (not corn, potatoes or bananas.) -berries are the best choice if you wish to eat fruit.   -lean meets (fish, chicken or [place] breasts) -vegan proteins for some meals - beans or tofu, whole grains, nuts and seeds -Replace bad fats with good fats - good fats include: fish, nuts and seeds, canola oil, olive oil -small amounts of low fat or non fat dairy -small amounts of100 % whole grains - check the lables  AVOID: -SUGAR, sweets, anything with added sugar, corn syrup or sweeteners -if you must have a sweetener, small amounts of stevia may be best -sweetened beverages -simple starches (rice, bread, potatoes, pasta, chips, etc - small amounts of 100% whole grains are ok) -red meat, pork, butter -fried foods, fast food,  processed food, excessive dairy, eggs and coconut.  3)Get at least 150 minutes of sweaty aerobic exercise per week.  4)Reduce stress - consider counseling, meditation and relaxation to balance other aspects of your life.          Colin Benton R., DO

## 2016-12-11 ENCOUNTER — Telehealth: Payer: Self-pay | Admitting: *Deleted

## 2016-12-11 ENCOUNTER — Ambulatory Visit (INDEPENDENT_AMBULATORY_CARE_PROVIDER_SITE_OTHER): Payer: No Typology Code available for payment source | Admitting: Family Medicine

## 2016-12-11 ENCOUNTER — Encounter: Payer: Self-pay | Admitting: Family Medicine

## 2016-12-11 VITALS — BP 120/80 | HR 71 | Temp 97.9°F | Ht 66.0 in | Wt 169.0 lb

## 2016-12-11 DIAGNOSIS — I10 Essential (primary) hypertension: Secondary | ICD-10-CM | POA: Diagnosis not present

## 2016-12-11 DIAGNOSIS — I2510 Atherosclerotic heart disease of native coronary artery without angina pectoris: Secondary | ICD-10-CM

## 2016-12-11 DIAGNOSIS — R7402 Elevation of levels of lactic acid dehydrogenase (LDH): Secondary | ICD-10-CM

## 2016-12-11 DIAGNOSIS — Z1211 Encounter for screening for malignant neoplasm of colon: Secondary | ICD-10-CM | POA: Diagnosis not present

## 2016-12-11 DIAGNOSIS — R740 Nonspecific elevation of levels of transaminase and lactic acid dehydrogenase [LDH]: Secondary | ICD-10-CM | POA: Diagnosis not present

## 2016-12-11 DIAGNOSIS — Z9189 Other specified personal risk factors, not elsewhere classified: Secondary | ICD-10-CM | POA: Diagnosis not present

## 2016-12-11 DIAGNOSIS — E785 Hyperlipidemia, unspecified: Secondary | ICD-10-CM

## 2016-12-11 DIAGNOSIS — Z23 Encounter for immunization: Secondary | ICD-10-CM

## 2016-12-11 DIAGNOSIS — Z1159 Encounter for screening for other viral diseases: Secondary | ICD-10-CM | POA: Diagnosis not present

## 2016-12-11 NOTE — Patient Instructions (Addendum)
BEFORE YOU LEAVE: -Flu shot -labs -Wendie Simmer, please contact [facility] to see what procedures they did on him - he thinks he had a colonoscopy, but I don't know where he would've had this other than possibly the there last year. If he did not have a colonoscopy there, please have Sheena check with his insurance to see if they cover the Cologuard test. Then either do the Cologuard test or refer him to GI for colonoscopy per his wishes. -follow up: 3-4 months  We have ordered labs or studies at this visit. It can take up to 1-2 weeks for results and processing. IF results require follow up or explanation, we will call you with instructions. Clinically stable results will be released to your [facility]. If you have not heard from [place] or cannot find your results in [facility] in 2 weeks please contact our office at [phone].  If you are not yet signed up for [facility], please consider signing up.   We recommend the following healthy lifestyle for LIFE: 1) Small portions.   Tip: eat off of a salad plate instead of a dinner plate.  Tip: It is ok to feel hungry after a meal - that likely means you ate an appropriate portion.  Tip: if you need more or a snack choose fruits, veggies and/or a handful of nuts or seeds.  2) Eat a healthy clean diet.   TRY TO EAT: -at least 5-7 servings of low sugar vegetables per day (not corn, potatoes or bananas.) -berries are the best choice if you wish to eat fruit.   -lean meets (fish, chicken or [place] breasts) -vegan proteins for some meals - beans or tofu, whole grains, nuts and seeds -Replace bad fats with good fats - good fats include: fish, nuts and seeds, canola oil, olive oil -small amounts of low fat or non fat dairy -small amounts of100 % whole grains - check the lables  AVOID: -SUGAR, sweets, anything with added sugar, corn syrup or sweeteners -if you must have a sweetener, small amounts of stevia may be best -sweetened beverages -simple  starches (rice, bread, potatoes, pasta, chips, etc - small amounts of 100% whole grains are ok) -red meat, pork, butter -fried foods, fast food, processed food, excessive dairy, eggs and coconut.  3)Get at least 150 minutes of sweaty aerobic exercise per week.  4)Reduce stress - consider counseling, meditation and relaxation to balance other aspects of your life.

## 2016-12-11 NOTE — Telephone Encounter (Signed)
-----   Message from Lucretia Kern, DO sent at 12/11/2016 12:29 PM EDT ----- He left without doing labs. Can you let him know to return for labs in the next week, lab appt and reorder labs I placed today for that day? Thanks!

## 2016-12-11 NOTE — Telephone Encounter (Signed)
I called the pt and his son [name] [name] answered for him and I advised him to have the pt call to schedule a lab appt and he agreed.

## 2016-12-21 ENCOUNTER — Other Ambulatory Visit: Payer: Self-pay | Admitting: Family Medicine

## 2017-01-03 ENCOUNTER — Other Ambulatory Visit: Payer: Self-pay | Admitting: Family Medicine

## 2017-02-18 ENCOUNTER — Encounter: Payer: Self-pay | Admitting: Family Medicine

## 2017-03-16 ENCOUNTER — Other Ambulatory Visit: Payer: Self-pay | Admitting: Family Medicine

## 2017-04-11 NOTE — Progress Notes (Signed)
HPI:  [name] [name] is a pleasant 67 y.o. here for follow up. Chronic medical problems summarized below were reviewed for changes.  Reports he is doing great.  Reports he is getting regular exercise and eating healthy.  No complaints today.  He still thinks he had a colonoscopy 2 years ago when we referred him to the surgeon about his rectal issues.  My assistant let me know today that she did check on this and he did not have a colonoscopy.  I was able to find his notes and it looks like he had an endoscopy, but not a colonoscopy.  Did explain this to him and did advise that he needs his colon cancer screening.  We discussed options.  He would prefer to do the Cologuard test if it is covered.  Denies CP, SOB, DOE, treatment intolerance or new symptoms.  Due for labs, colon ca screening. LDH did executive panel at outside clinic and this was elevated (asymptomatic, no other issues, otherwise executive panel was unremarkable), bmp, cbc, hgbA1c  1)HTN: -medications include: labetolol 200mg  tid and lisinopril 40 mg daily  2)CAD: -hx inferior lat wall MI in 06/1998, normal myoview per cardiology notes in 02/2008, followed by Dr. Haroldine Laws in Cardiology  -medications include: ASA 81mg  qs, lipitor 80 mg qd, labetolol 200mg  tid, lisinopril 40 mg daily and niacin 500mg  daily (cardiologist ok with stopping niacin per notes, but pt opted to continue) - also has nitrostat  3)HLD: -medications include: lipitor 80mg  daily and niacin 500mg  daily - wants to continue the niacin as feels good taking it -stable  4) s/p L knee replacement: -doing well, exercising and doing great  ROS: See pertinent positives and negatives per HPI.  Past Medical History:  Diagnosis Date  . ABNORMAL EKG 12/20/2008   Qualifier: Diagnosis of  By: Haroldine Laws, MD, Eileen Stanford Hx of coronary artery disease   . Hyperlipidemia   . Hypertension   . Myocardial infarction (lateral wall) [facility]) May 2000   inferior lateral  wall myocardial infarction    Past Surgical History:  Procedure Laterality Date  . ANGIOPLASTY    . CARDIAC CATHETERIZATION      LAD 60-70% mid lesion, ramus 30% ostial lesion, circumflex was dominant with an occluded obtuse marginal branch which was angioplastied.  EF was 55%.   . 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 Heart disease Mother   . Hyperlipidemia Mother   . Hypertension Mother   . Diabetes Mother   . Diabetes Brother     Social History   Socioeconomic History  . Marital status: Married    Spouse name: None  . Number of children: None  . Years of education: None  . Highest education level: None  Social Needs  . Financial resource strain: None  . Food insecurity - worry: None  . Food insecurity - inability: None  . Transportation needs - medical: None  . Transportation needs - non-medical: None  Occupational History  . None  Tobacco Use  . Smoking status: Former Smoker    Types: Cigarettes    Last attempt to quit: 05/05/1991    Years since quitting: 25.9  . Smokeless tobacco: Never Used  Substance and Sexual Activity  . Alcohol use: Yes    Comment: rare; 1-2 drinks  . Drug use: No  . Sexual activity: None  Other Topics Concern  . None  Social  History Narrative   Work or School: Insurance claims handler Situation: lives with wife and son      Spiritual Beliefs: none      Lifestyle: CV exercise 10 minutes per day, healthy              Current Outpatient Medications:  .  aspirin 81 MG chewable tablet, Chew by mouth daily., Disp: , Rfl:  .  atorvastatin (LIPITOR) 80 MG tablet, TAKE 1 TABLET EVERY DAY *NEED APPT*, Disp: 30 tablet, Rfl: 5 .  labetalol (NORMODYNE) 200 MG tablet, TAKE 1 TABLET BY MOUTH THREE TIMES A DAY, Disp: 90 tablet, Rfl: 2 .  lisinopril (PRINIVIL,ZESTRIL) 40 MG tablet, TAKE 1 TABLET BY MOUTH EVERY DAY, Disp: 90 tablet, Rfl: 1 .  Multiple Vitamin  (MULTI-VITAMINS) TABS, Take by mouth., Disp: , Rfl:  .  niacin (NIASPAN) 500 MG CR tablet, Take 1 tablet (500 mg total) by mouth at bedtime., Disp: 30 tablet, Rfl: 0 .  nitroGLYCERIN (NITROSTAT) 0.4 MG SL tablet, PLACE 1 TABLET (0.4 MG TOTAL) UNDER THE TONGUE EVERY 5 (FIVE) MINUTES AS NEEDED FOR CHEST PAIN., Disp: 25 tablet, Rfl: 0  EXAM:  Vitals:   04/12/17 0853  BP: 110/72  Pulse: 61  Temp: (!) 97.4 F (36.3 C)    Body mass index is 27.44 kg/m.  GENERAL: vitals reviewed and listed above, alert, oriented, appears well hydrated and in no acute distress  HEENT: atraumatic, conjunttiva clear, no obvious abnormalities on inspection of external nose and ears  NECK: no obvious masses on inspection  LUNGS: clear to auscultation bilaterally, no wheezes, rales or rhonchi, good air movement  CV: HRRR, no peripheral edema  MS: moves all extremities without noticeable abnormality  PSYCH: pleasant and cooperative, no obvious depression or anxiety  ASSESSMENT AND PLAN:  Discussed the following assessment and plan:  Essential hypertension, benign - Plan: Basic metabolic panel, CBC  Pure hypercholesterolemia  Atherosclerosis of native coronary artery of native heart without angina pectoris  Elevated LDH - Plan: Lactate Dehydrogenase  Colon cancer screening  -Labs per orders -Strongly advised that he complete colon cancer screening -He prefers to do the Cologuard if possible, I advised my staff to check on this at his last visit. I advised them again today in order if it is still covered.  Advised if Cologuard is not covered that he complete a colonoscopy and advised my staff to order this as well if such is the case.   Advised patient to contact us if any trouble setting up for scheduling 1 of these options. -Congratulated him on a healthy lifestyle and encouraged him to continue this -Patient advised to return or notify a doctor immediately if symptoms worsen or persist or new  concerns arise.  Patient Instructions  BEFORE YOU LEAVE: -Find out if Cologuard is covered by his insurance and please place an order for this, otherwise please refer for colonoscopy for colon cancer screening -follow up: 3-4 months   We highly recommend and advised that you complete your colon cancer screening.  Please do the Cologuard test or a colonoscopy.  We have ordered labs or studies at this visit. It can take up to 1-2 weeks for results and processing. IF results require follow up or explanation, we will call you with instructions. Clinically stable results will be released to your [facility]. If you have not heard from [place] or cannot find your results in [facility] in 2 weeks please contact our office at [phone].  If  you are not yet signed up for [facility], please consider signing up.   We recommend the following healthy lifestyle for LIFE: 1) Small portions. But, make sure to get regular (at least 3 per day), healthy meals and small healthy snacks if needed.  2) Eat a healthy clean diet.   TRY TO EAT: -at least 5-7 servings of low sugar, colorful, and nutrient rich vegetables per day (not corn, potatoes or bananas.) -berries are the best choice if you wish to eat fruit (only eat small amounts if trying to reduce weight)  -lean meets (fish, white meat of chicken or [place]) -vegan proteins for some meals - beans or tofu, whole grains, nuts and seeds -Replace bad fats with good fats - good fats include: fish, nuts and seeds, canola oil, olive oil -small amounts of low fat or non fat dairy -small amounts of100 % whole grains - check the lables -drink plenty of water  AVOID: -SUGAR, sweets, anything with added sugar, corn syrup or sweeteners - must read labels as even foods advertised as "healthy" often are loaded with sugar -if you must have a sweetener, small amounts of stevia may be best -sweetened beverages and artificially sweetened beverages -simple starches (rice, bread,  potatoes, pasta, chips, etc - small amounts of 100% whole grains are ok) -red meat, pork, butter -fried foods, fast food, processed food, excessive dairy, eggs and coconut.  3)Get at least 150 minutes of sweaty aerobic exercise per week.  4)Reduce stress - consider counseling, meditation and relaxation to balance other aspects of your life.          Lucretia Kern, DO

## 2017-04-12 ENCOUNTER — Ambulatory Visit: Payer: No Typology Code available for payment source | Admitting: Family Medicine

## 2017-04-12 ENCOUNTER — Encounter: Payer: Self-pay | Admitting: Family Medicine

## 2017-04-12 VITALS — BP 110/72 | HR 61 | Temp 97.4°F | Ht 66.0 in | Wt 170.0 lb

## 2017-04-12 DIAGNOSIS — I2510 Atherosclerotic heart disease of native coronary artery without angina pectoris: Secondary | ICD-10-CM

## 2017-04-12 DIAGNOSIS — R740 Nonspecific elevation of levels of transaminase and lactic acid dehydrogenase [LDH]: Secondary | ICD-10-CM | POA: Diagnosis not present

## 2017-04-12 DIAGNOSIS — Z1211 Encounter for screening for malignant neoplasm of colon: Secondary | ICD-10-CM

## 2017-04-12 DIAGNOSIS — E7800 Pure hypercholesterolemia, unspecified: Secondary | ICD-10-CM

## 2017-04-12 DIAGNOSIS — I10 Essential (primary) hypertension: Secondary | ICD-10-CM | POA: Diagnosis not present

## 2017-04-12 DIAGNOSIS — R7402 Elevation of levels of lactic acid dehydrogenase (LDH): Secondary | ICD-10-CM

## 2017-04-12 LAB — BASIC METABOLIC PANEL
BUN: 20 mg/dL (ref 6–23)
CHLORIDE: 102 meq/L (ref 96–112)
CO2: 28 mEq/L (ref 19–32)
CREATININE: 1.09 mg/dL (ref 0.40–1.50)
Calcium: 9.8 mg/dL (ref 8.4–10.5)
GFR: 71.75 mL/min (ref >60.00)
GLUCOSE: 104 mg/dL — AB (ref 70–99)
Potassium: 4.4 mEq/L (ref 3.5–5.1)
Sodium: 138 mEq/L (ref 135–145)

## 2017-04-12 LAB — CBC
HCT: 46.2 % (ref 39.0–52.0)
Hemoglobin: 15.8 g/dL (ref 13.0–17.0)
MCHC: 34.2 g/dL (ref 30.0–36.0)
MCV: 84.5 fl (ref 78.0–100.0)
Platelets: 199 10*3/uL (ref 150.0–400.0)
RBC: 5.47 Mil/uL (ref 4.22–5.81)
RDW: 13.2 % (ref 11.5–15.5)
WBC: 8 10*3/uL (ref 4.0–10.5)

## 2017-04-12 NOTE — Patient Instructions (Signed)
BEFORE YOU LEAVE: -Find out if Cologuard is covered by his insurance and please place an order for this, otherwise please refer for colonoscopy for colon cancer screening -follow up: 3-4 months   We highly recommend and advised that you complete your colon cancer screening.  Please do the Cologuard test or a colonoscopy.  We have ordered labs or studies at this visit. It can take up to 1-2 weeks for results and processing. IF results require follow up or explanation, we will call you with instructions. Clinically stable results will be released to your [facility]. If you have not heard from [place] or cannot find your results in [facility] in 2 weeks please contact our office at [phone].  If you are not yet signed up for [facility], please consider signing up.   We recommend the following healthy lifestyle for LIFE: 1) Small portions. But, make sure to get regular (at least 3 per day), healthy meals and small healthy snacks if needed.  2) Eat a healthy clean diet.   TRY TO EAT: -at least 5-7 servings of low sugar, colorful, and nutrient rich vegetables per day (not corn, potatoes or bananas.) -berries are the best choice if you wish to eat fruit (only eat small amounts if trying to reduce weight)  -lean meets (fish, white meat of chicken or [place]) -vegan proteins for some meals - beans or tofu, whole grains, nuts and seeds -Replace bad fats with good fats - good fats include: fish, nuts and seeds, canola oil, olive oil -small amounts of low fat or non fat dairy -small amounts of100 % whole grains - check the lables -drink plenty of water  AVOID: -SUGAR, sweets, anything with added sugar, corn syrup or sweeteners - must read labels as even foods advertised as "healthy" often are loaded with sugar -if you must have a sweetener, small amounts of stevia may be best -sweetened beverages and artificially sweetened beverages -simple starches (rice, bread, potatoes, pasta, chips, etc - small amounts of  100% whole grains are ok) -red meat, pork, butter -fried foods, fast food, processed food, excessive dairy, eggs and coconut.  3)Get at least 150 minutes of sweaty aerobic exercise per week.  4)Reduce stress - consider counseling, meditation and relaxation to balance other aspects of your life.

## 2017-04-13 LAB — LACTATE DEHYDROGENASE: LDH: 169 U/L (ref 120–250)

## 2017-06-08 ENCOUNTER — Other Ambulatory Visit: Payer: Self-pay | Admitting: Family Medicine

## 2017-06-28 ENCOUNTER — Other Ambulatory Visit: Payer: Self-pay | Admitting: Family Medicine

## 2017-07-11 NOTE — Progress Notes (Signed)
HPI:  Using dictation device. Unfortunately this device frequently misinterprets words/phrases.  [name] [name] is a pleasant 67 y.o. here for follow up. Chronic medical problems summarized below were reviewed for changes. Doing well. Exercising on a regular basis - 5 miles per day walking and then biking in the evenings. No complaints. Denies CP, SOB, DOE, swelling or treatment intolerance or new symptoms. Colon ca screening: addressed a number of times in the past. At last visit advised staff to check on coverage for him for cologuard vs colonoscopy and order. Advised pt to contact us if not done promptly.  1)HTN: -medications include: labetolol 200mg  tid and lisinopril 40 mg daily  2)CAD: -hx inferior lat wall MI in 06/1998, normal myoview per cardiology notes in 02/2008, followed by Dr. Haroldine Laws in Cardiology  -medications include: ASA 81mg  qs, lipitor 80 mg qd, labetolol 200mg  tid, lisinopril 40 mg daily and niacin 500mg  daily (cardiologist ok with stopping niacin per notes, but pt opted to continue) - also has nitrostat  3)HLD: -medications include: lipitor 80mg  daily and niacin 500mg  daily - wants to continue the niacin as feels good taking it -stable  4) s/p L knee replacement: -doing well, exercising and doing great   ROS: See pertinent positives and negatives per HPI.  Past Medical History:  Diagnosis Date  . ABNORMAL EKG 12/20/2008   Qualifier: Diagnosis of  By: Haroldine Laws, MD, Eileen Stanford Hx of coronary artery disease   . Hyperlipidemia   . Hypertension   . Myocardial infarction (lateral wall) [facility]) May 2000   inferior lateral wall myocardial infarction    Past Surgical History:  Procedure Laterality Date  . ANGIOPLASTY    . CARDIAC CATHETERIZATION      LAD 60-70% mid lesion, ramus 30% ostial lesion, circumflex was dominant with an occluded obtuse marginal branch which was angioplastied.  EF was 55%.   . 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 Heart disease Mother   . Hyperlipidemia Mother   . Hypertension Mother   . Diabetes Mother   . Diabetes Brother     SOCIAL HX: see HPI   Current Outpatient Medications:  .  aspirin 81 MG chewable tablet, Chew by mouth daily., Disp: , Rfl:  .  atorvastatin (LIPITOR) 80 MG tablet, TAKE 1 TABLET EVERY DAY *NEED APPT*, Disp: 30 tablet, Rfl: 5 .  labetalol (NORMODYNE) 200 MG tablet, TAKE 1 TABLET BY MOUTH THREE TIMES A DAY, Disp: 90 tablet, Rfl: 2 .  lisinopril (PRINIVIL,ZESTRIL) 40 MG tablet, TAKE 1 TABLET BY MOUTH EVERY DAY, Disp: 90 tablet, Rfl: 1 .  Multiple Vitamin (MULTI-VITAMINS) TABS, Take by mouth., Disp: , Rfl:  .  niacin (NIASPAN) 500 MG CR tablet, Take 1 tablet (500 mg total) by mouth at bedtime., Disp: 30 tablet, Rfl: 0 .  nitroGLYCERIN (NITROSTAT) 0.4 MG SL tablet, PLACE 1 TABLET (0.4 MG TOTAL) UNDER THE TONGUE EVERY 5 (FIVE) MINUTES AS NEEDED FOR CHEST PAIN., Disp: 25 tablet, Rfl: 0  EXAM:  Vitals:   07/12/17 0752  BP: 100/62  Pulse: 66  Temp: 97.6 F (36.4 C)    Body mass index is 26.33 kg/m.  GENERAL: vitals reviewed and listed above, alert, oriented, appears well hydrated and in no acute distress  HEENT: atraumatic, conjunttiva clear, no obvious abnormalities on inspection of external nose and ears  NECK: no obvious masses on inspection  LUNGS: clear to auscultation  bilaterally, no wheezes, rales or rhonchi, good air movement  CV: HRRR, no peripheral edema  MS: moves all extremities without noticeable abnormality  PSYCH: pleasant and cooperative, no obvious depression or anxiety  ASSESSMENT AND PLAN:  Discussed the following assessment and plan:  Essential hypertension, benign  Pure hypercholesterolemia  Atherosclerosis of native coronary artery of native heart without angina pectoris   -Patient advised to return or notify a doctor immediately if symptoms worsen or persist  or new concerns arise.  Patient Instructions  BEFORE YOU LEAVE: -follow up: 3 months of CPE    We recommend the following healthy lifestyle for LIFE: 1) Small portions. But, make sure to get regular (at least 3 per day), healthy meals and small healthy snacks if needed.  2) Eat a healthy clean diet.   TRY TO EAT: -at least 5-7 servings of low sugar, colorful, and nutrient rich vegetables per day (not corn, potatoes or bananas.) -berries are the best choice if you wish to eat fruit (only eat small amounts if trying to reduce weight)  -lean meets (fish, white meat of chicken or [place]) -vegan proteins for some meals - beans or tofu, whole grains, nuts and seeds -Replace bad fats with good fats - good fats include: fish, nuts and seeds, canola oil, olive oil -small amounts of low fat or non fat dairy -small amounts of100 % whole grains - check the lables -drink plenty of water  AVOID: -SUGAR, sweets, anything with added sugar, corn syrup or sweeteners - must read labels as even foods advertised as "healthy" often are loaded with sugar -if you must have a sweetener, small amounts of stevia may be best -sweetened beverages and artificially sweetened beverages -simple starches (rice, bread, potatoes, pasta, chips, etc - small amounts of 100% whole grains are ok) -red meat, pork, butter -fried foods, fast food, processed food, excessive dairy, eggs and coconut.  3)Get at least 150 minutes of sweaty aerobic exercise per week.  4)Reduce stress - consider counseling, meditation and relaxation to balance other aspects of your life.     Lucretia Kern, DO

## 2017-07-12 ENCOUNTER — Ambulatory Visit: Payer: PRIVATE HEALTH INSURANCE | Admitting: Family Medicine

## 2017-07-12 ENCOUNTER — Encounter: Payer: Self-pay | Admitting: Family Medicine

## 2017-07-12 VITALS — BP 100/62 | HR 66 | Temp 97.6°F | Ht 66.0 in | Wt 163.1 lb

## 2017-07-12 DIAGNOSIS — I2510 Atherosclerotic heart disease of native coronary artery without angina pectoris: Secondary | ICD-10-CM | POA: Diagnosis not present

## 2017-07-12 DIAGNOSIS — E7800 Pure hypercholesterolemia, unspecified: Secondary | ICD-10-CM

## 2017-07-12 DIAGNOSIS — I10 Essential (primary) hypertension: Secondary | ICD-10-CM | POA: Diagnosis not present

## 2017-07-12 NOTE — Patient Instructions (Signed)
BEFORE YOU LEAVE: -follow up: 3 months of CPE    We recommend the following healthy lifestyle for LIFE: 1) Small portions. But, make sure to get regular (at least 3 per day), healthy meals and small healthy snacks if needed.  2) Eat a healthy clean diet.   TRY TO EAT: -at least 5-7 servings of low sugar, colorful, and nutrient rich vegetables per day (not corn, potatoes or bananas.) -berries are the best choice if you wish to eat fruit (only eat small amounts if trying to reduce weight)  -lean meets (fish, white meat of chicken or [place]) -vegan proteins for some meals - beans or tofu, whole grains, nuts and seeds -Replace bad fats with good fats - good fats include: fish, nuts and seeds, canola oil, olive oil -small amounts of low fat or non fat dairy -small amounts of100 % whole grains - check the lables -drink plenty of water  AVOID: -SUGAR, sweets, anything with added sugar, corn syrup or sweeteners - must read labels as even foods advertised as "healthy" often are loaded with sugar -if you must have a sweetener, small amounts of stevia may be best -sweetened beverages and artificially sweetened beverages -simple starches (rice, bread, potatoes, pasta, chips, etc - small amounts of 100% whole grains are ok) -red meat, pork, butter -fried foods, fast food, processed food, excessive dairy, eggs and coconut.  3)Get at least 150 minutes of sweaty aerobic exercise per week.  4)Reduce stress - consider counseling, meditation and relaxation to balance other aspects of your life.

## 2017-07-19 LAB — COLOGUARD

## 2017-07-20 ENCOUNTER — Telehealth: Payer: Self-pay | Admitting: Family Medicine

## 2017-07-20 DIAGNOSIS — R195 Other fecal abnormalities: Secondary | ICD-10-CM

## 2017-07-20 NOTE — Addendum Note (Signed)
Addended by: Agnes Lawrence on: 07/20/2017 04:13 PM   Modules accepted: Orders

## 2017-07-20 NOTE — Telephone Encounter (Signed)
I left a message at the pts cell number and his sons number ([name]) to return my call.

## 2017-07-20 NOTE — Telephone Encounter (Signed)
Fax placed on your desk.

## 2017-07-20 NOTE — Telephone Encounter (Signed)
[name] called back and was informed of the results.  He is aware the referral was placed and someone will call with appt info.

## 2017-07-20 NOTE — Telephone Encounter (Signed)
Positive cologuard.  Called report to [place].  Ahmed from [employer] stated will send fax to [phone].  Lab can be directly notified at [phone] reference number [id number].

## 2017-07-20 NOTE — Telephone Encounter (Signed)
See form in your box. Let pt know and recommend GI evaluation for colonosocpy for possible precancer or cancer or polyp.

## 2017-07-20 NOTE — Telephone Encounter (Signed)
Cologuard report-positive, I found fax with results and will give to Dr. Maudie Mercury for review.

## 2017-08-16 ENCOUNTER — Other Ambulatory Visit: Payer: Self-pay | Admitting: Family Medicine

## 2017-08-17 ENCOUNTER — Encounter: Payer: Self-pay | Admitting: Gastroenterology

## 2017-09-29 ENCOUNTER — Other Ambulatory Visit: Payer: Self-pay | Admitting: Family Medicine

## 2017-09-29 DIAGNOSIS — I2510 Atherosclerotic heart disease of native coronary artery without angina pectoris: Secondary | ICD-10-CM

## 2017-10-10 DIAGNOSIS — K635 Polyp of colon: Secondary | ICD-10-CM

## 2017-10-10 HISTORY — DX: Polyp of colon: K63.5

## 2017-10-12 ENCOUNTER — Encounter: Payer: PRIVATE HEALTH INSURANCE | Admitting: Family Medicine

## 2017-10-24 ENCOUNTER — Other Ambulatory Visit: Payer: Self-pay | Admitting: Family Medicine

## 2017-10-27 ENCOUNTER — Ambulatory Visit (AMBULATORY_SURGERY_CENTER): Payer: Self-pay | Admitting: *Deleted

## 2017-10-27 VITALS — Ht 68.5 in | Wt 170.4 lb

## 2017-10-27 DIAGNOSIS — R195 Other fecal abnormalities: Secondary | ICD-10-CM

## 2017-10-27 MED ORDER — NA SULFATE-K SULFATE-MG SULF 17.5-3.13-1.6 GM/177ML PO SOLN: 1.0000 | Freq: Once | ORAL | 0 refills | Status: AC

## 2017-10-27 NOTE — Progress Notes (Signed)
No egg or soy allergy known to patient  No issues with past sedation with any surgeries  or procedures, no intubation problems  No diet pills per patient No home 02 use per patient  No blood thinners per patient  Pt denies issues with constipation  No A fib or A flutter  EMMI video sent to pt's e mail  $15 Suprep coupon to [pt in [place] today  Pt and Son in [place] today- pt speaks English and son helped interpret as well when Pt didn't understand , which was not much - I had Pt sign the interpreter  responsibility waiver- Pt repeated Prep instructions to me in PV today with good understanding

## 2017-10-28 ENCOUNTER — Encounter: Payer: Self-pay | Admitting: Gastroenterology

## 2017-11-05 ENCOUNTER — Encounter: Payer: Self-pay | Admitting: Gastroenterology

## 2017-11-05 ENCOUNTER — Ambulatory Visit (AMBULATORY_SURGERY_CENTER): Payer: PRIVATE HEALTH INSURANCE | Admitting: Gastroenterology

## 2017-11-05 ENCOUNTER — Other Ambulatory Visit: Payer: Self-pay

## 2017-11-05 ENCOUNTER — Observation Stay (HOSPITAL_COMMUNITY)
Admission: EM | Admit: 2017-11-05 | Discharge: 2017-11-06 | DRG: 920 | Disposition: A | Discharge status: Home / Self Care | Payer: No Typology Code available for payment source | Attending: Internal Medicine | Admitting: Internal Medicine

## 2017-11-05 ENCOUNTER — Encounter (HOSPITAL_COMMUNITY): Payer: Self-pay

## 2017-11-05 VITALS — BP 123/66 | HR 54 | Temp 98.9°F | Resp 12 | Ht 68.5 in | Wt 170.4 lb

## 2017-11-05 DIAGNOSIS — K635 Polyp of colon: Secondary | ICD-10-CM

## 2017-11-05 DIAGNOSIS — Z8249 Family history of ischemic heart disease and other diseases of the circulatory system: Secondary | ICD-10-CM | POA: Diagnosis not present

## 2017-11-05 DIAGNOSIS — Z8719 Personal history of other diseases of the digestive system: Secondary | ICD-10-CM

## 2017-11-05 DIAGNOSIS — Y838 Other surgical procedures as the cause of abnormal reaction of the patient, or of later complication, without mention of misadventure at the time of the procedure: Secondary | ICD-10-CM | POA: Diagnosis present

## 2017-11-05 DIAGNOSIS — I2510 Atherosclerotic heart disease of native coronary artery without angina pectoris: Secondary | ICD-10-CM | POA: Diagnosis present

## 2017-11-05 DIAGNOSIS — Z79899 Other long term (current) drug therapy: Secondary | ICD-10-CM

## 2017-11-05 DIAGNOSIS — E7800 Pure hypercholesterolemia, unspecified: Secondary | ICD-10-CM | POA: Diagnosis present

## 2017-11-05 DIAGNOSIS — K9189 Other postprocedural complications and disorders of digestive system: Secondary | ICD-10-CM | POA: Diagnosis not present

## 2017-11-05 DIAGNOSIS — I252 Old myocardial infarction: Secondary | ICD-10-CM

## 2017-11-05 DIAGNOSIS — D125 Benign neoplasm of sigmoid colon: Secondary | ICD-10-CM

## 2017-11-05 DIAGNOSIS — E860 Dehydration: Secondary | ICD-10-CM | POA: Diagnosis present

## 2017-11-05 DIAGNOSIS — Z8349 Family history of other endocrine, nutritional and metabolic diseases: Secondary | ICD-10-CM | POA: Diagnosis not present

## 2017-11-05 DIAGNOSIS — R195 Other fecal abnormalities: Secondary | ICD-10-CM | POA: Diagnosis not present

## 2017-11-05 DIAGNOSIS — Z96652 Presence of left artificial knee joint: Secondary | ICD-10-CM | POA: Diagnosis present

## 2017-11-05 DIAGNOSIS — Z87891 Personal history of nicotine dependence: Secondary | ICD-10-CM

## 2017-11-05 DIAGNOSIS — E785 Hyperlipidemia, unspecified: Secondary | ICD-10-CM | POA: Diagnosis present

## 2017-11-05 DIAGNOSIS — K91840 Postprocedural hemorrhage and hematoma of a digestive system organ or structure following a digestive system procedure: Principal | ICD-10-CM | POA: Diagnosis present

## 2017-11-05 DIAGNOSIS — K922 Gastrointestinal hemorrhage, unspecified: Secondary | ICD-10-CM | POA: Diagnosis not present

## 2017-11-05 DIAGNOSIS — I10 Essential (primary) hypertension: Secondary | ICD-10-CM | POA: Diagnosis present

## 2017-11-05 DIAGNOSIS — D128 Benign neoplasm of rectum: Secondary | ICD-10-CM

## 2017-11-05 DIAGNOSIS — E871 Hypo-osmolality and hyponatremia: Secondary | ICD-10-CM | POA: Diagnosis present

## 2017-11-05 DIAGNOSIS — Z7982 Long term (current) use of aspirin: Secondary | ICD-10-CM | POA: Diagnosis not present

## 2017-11-05 HISTORY — PX: COLONOSCOPY W/ POLYPECTOMY: SHX1380

## 2017-11-05 HISTORY — DX: Atherosclerotic heart disease of native coronary artery without angina pectoris: I25.10

## 2017-11-05 HISTORY — DX: Polyp of colon: K63.5

## 2017-11-05 HISTORY — DX: Gastrointestinal hemorrhage, unspecified: K92.2

## 2017-11-05 LAB — CBC WITH DIFFERENTIAL/PLATELET
Abs Immature Granulocytes: 0 10*3/uL (ref 0.0–0.1)
Basophils Absolute: 0 10*3/uL (ref 0.0–0.1)
Basophils Relative: 0 %
Eosinophils Absolute: 0.1 10*3/uL (ref 0.0–0.7)
Eosinophils Relative: 2 %
HCT: 48.2 % (ref 39.0–52.0)
Hemoglobin: 15.3 g/dL (ref 13.0–17.0)
Immature Granulocytes: 0 %
Lymphocytes Relative: 22 %
Lymphs Abs: 1.5 10*3/uL (ref 0.7–4.0)
MCH: 28.1 pg (ref 26.0–34.0)
MCHC: 31.7 g/dL (ref 30.0–36.0)
MCV: 88.6 fL (ref 78.0–100.0)
Monocytes Absolute: 0.6 10*3/uL (ref 0.1–1.0)
Monocytes Relative: 9 %
Neutro Abs: 4.6 10*3/uL (ref 1.7–7.7)
Neutrophils Relative %: 67 %
Platelets: 203 10*3/uL (ref 150–400)
RBC: 5.44 MIL/uL (ref 4.22–5.81)
RDW: 12.6 % (ref 11.5–15.5)
WBC: 6.9 10*3/uL (ref 4.0–10.5)

## 2017-11-05 LAB — ABO/RH: ABO/RH(D): A NEG

## 2017-11-05 LAB — BASIC METABOLIC PANEL
ANION GAP: 11 (ref 5–15)
BUN: 15 mg/dL (ref 8–23)
CALCIUM: 9.3 mg/dL (ref 8.9–10.3)
CO2: 20 mmol/L — AB (ref 22–32)
CREATININE: 1.19 mg/dL (ref 0.61–1.24)
Chloride: 110 mmol/L (ref 98–111)
Glucose, Bld: 84 mg/dL (ref 70–99)
Potassium: 4.4 mmol/L (ref 3.5–5.1)
SODIUM: 141 mmol/L (ref 135–145)

## 2017-11-05 LAB — TYPE AND SCREEN
ABO/RH(D): A NEG
ANTIBODY SCREEN: NEGATIVE

## 2017-11-05 MED ORDER — ACETAMINOPHEN 650 MG RE SUPP: 650.0000 mg | Freq: Four times a day (QID) | RECTAL | Status: DC | PRN

## 2017-11-05 MED ORDER — ATORVASTATIN CALCIUM 20 MG PO TABS
40.0000 mg | ORAL_TABLET | Freq: Every day | ORAL | Status: DC
  Administered 2017-11-05: 40 mg via ORAL
  Filled 2017-11-05: qty 2

## 2017-11-05 MED ORDER — ACETAMINOPHEN 325 MG PO TABS: 650.0000 mg | ORAL_TABLET | Freq: Four times a day (QID) | ORAL | Status: DC | PRN

## 2017-11-05 MED ORDER — SENNOSIDES-DOCUSATE SODIUM 8.6-50 MG PO TABS: 1.0000 | ORAL_TABLET | Freq: Every evening | ORAL | Status: DC | PRN

## 2017-11-05 MED ORDER — LABETALOL HCL 200 MG PO TABS
200.0000 mg | ORAL_TABLET | Freq: Three times a day (TID) | ORAL | Status: DC
  Administered 2017-11-05 – 2017-11-06 (×3): 200 mg via ORAL
  Filled 2017-11-05 (×3): qty 1

## 2017-11-05 MED ORDER — ONDANSETRON HCL 4 MG PO TABS: 4.0000 mg | ORAL_TABLET | Freq: Four times a day (QID) | ORAL | Status: DC | PRN

## 2017-11-05 MED ORDER — SODIUM CHLORIDE 0.9 % IV SOLN
INTRAVENOUS | Status: DC
  Administered 2017-11-05: 18:00:00 via INTRAVENOUS

## 2017-11-05 MED ORDER — ONDANSETRON HCL 4 MG/2ML IJ SOLN: 4.0000 mg | Freq: Four times a day (QID) | INTRAMUSCULAR | Status: DC | PRN

## 2017-11-05 MED ORDER — SODIUM CHLORIDE 0.9 % IV SOLN: 500.0000 mL | Freq: Once | INTRAVENOUS | Status: AC

## 2017-11-05 MED ORDER — BISACODYL 5 MG PO TBEC: 5.0000 mg | DELAYED_RELEASE_TABLET | Freq: Every day | ORAL | Status: DC | PRN

## 2017-11-05 NOTE — ED Triage Notes (Addendum)
Pt from [place] [facility] via ems; pt had a colonoscopy this am and polyp removal; pt bleeding post op; pt sent to ED for monitoring; Hx MI, HTN; pt takes a beta blocker; pt has no complaints  120/80 HR 52 98% RA RR 16

## 2017-11-05 NOTE — Progress Notes (Signed)
Report to PACU, RN, vss, BBS= Clear.  

## 2017-11-05 NOTE — Progress Notes (Signed)
Called to room to assist during endoscopic procedure.  Patient ID and intended procedure confirmed with present staff. Received instructions for my participation in the procedure from the performing physician.  

## 2017-11-05 NOTE — Progress Notes (Addendum)
Pt speaks [place] as primary language.  He does speak some english.  Interpreter in her today through [place], [place].  Maw   Pt's states no medical or surgical changes since previsit or office visit.

## 2017-11-05 NOTE — Consult Note (Signed)
Referring Provider: [facility]   Primary Care Physician:  Lucretia Kern, DO Primary Gastroenterologist: Jackquline Denmark  Reason for Consultation:   Post-polypectomy bleed    ASSESSMENT AND PLAN:    67 yo male with post-polypectomy bleeding. He is s/p colonoscopy with removal of a 30 mm pedunculated sigmoid polyp this am. Bleeding was immediate. Area cauterized, injected with Epi and 3 clips placed.  Bleeding stopped but due to concern for recurrent bleeding from high risk lesion patient being admitted for observation -Appreciate [facility]'s assistance with admission -Hopefully he will not need repeat lower endoscopy but if so then flex sig may suffice given location of polypectomy.  -NPO for now. If no further bleeding then can have sips of clears starting around 8pm ( in case emergent  / repeat lower endoscopy needed).  -Patient will be seen by Dr. Collene Mares tomorrow (covering for our practice). Hopefully there will be no further bleeding and patient can be discharged.  HPI: [name] [name] is a 67 y.o. male who underwent a colonoscopy this morning for evaluation of a positive Cologuard.  A small sessile polyp was removed from the rectum.  A large distal pedunculated polyp was removed via hot snare from the sigmoid colon. This was complicated by bleeding. Site cauterized. Due to persistent bleeding the site was injected and 3 clips placed.  Patient was advised to come to the emergency department to be admitted for observation due to concern for recurrent bleeding lumpectomy site.   Interpreter has just left.  Son present and speaks English so he serves as Astronomer. In the ED patient is afebrile.  He is bradycardic in the mid to upper 40s.  Slightly hypertensive.  He has had one episode of rectal bleeding which occurred while standing up to urinate.  Patient is asymptomatic.  No  dizziness, lightheadedness, shortness of breath, chest pain.  No abdominal pain.  No general medical  complaints.  Labs are pending  Colonoscopy 11/05/17:  - A 30 mm polyp with thick pedicle was found in the distal sigmoid colon, 25 cm from the anal verge. The polyp was removed with a hot snare. Resection and retrieval were complete. There was active pulsatile bleeding. The site was cauterized but the bleeding continued. 2 cc of 1 in 10,000 epinephrine was injected around the site and 3 clips placed at the postpolyectomy site with hemostasis. There was no bleeding at the end of the procedure. - A 6 mm polyp was found in the rectum. The polyp was sessile. The polyp was removed with a hot snare. Resection and retrieval were complete  Past Medical History:  Diagnosis Date  . ABNORMAL EKG 12/20/2008   Qualifier: Diagnosis of  By: Haroldine Laws, MD, Eileen Stanford Arthritis    left wrist  . Hx of coronary artery disease   . Hyperlipidemia   . Hypertension   . Myocardial infarction (lateral wall) [facility]) May 2000   inferior lateral wall myocardial infarction    Past Surgical History:  Procedure Laterality Date  . ANGIOPLASTY    . CARDIAC CATHETERIZATION      LAD 60-70% mid lesion, ramus 30% ostial lesion, circumflex was dominant with an occluded obtuse marginal branch which was angioplastied.  EF was 55%.   . 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 Authorizing Provider  aspirin 81 MG chewable tablet Chew by mouth daily.  [provider]  atorvastatin (LIPITOR) 80 MG tablet TAKE 1 TABLET EVERY DAY *NEED APPT* 06/08/17   Colin Benton R, DO  labetalol (NORMODYNE) 200 MG tablet TAKE 1 TABLET BY MOUTH THREE TIMES A DAY 10/25/17   Lucretia Kern, DO  lisinopril (PRINIVIL,ZESTRIL) 40 MG tablet TAKE 1 TABLET BY MOUTH EVERY DAY 06/29/17   Lucretia Kern, DO  Multiple Vitamin (MULTI-VITAMINS) TABS Take by mouth. 06/07/14   [provider]  niacin (NIASPAN) 500 MG CR tablet Take 1  tablet (500 mg total) by mouth at bedtime. 08/09/12   Bensimhon, Shaune Pascal, MD  nitroGLYCERIN (NITROSTAT) 0.4 MG SL tablet PLACE 1 TABLET (0.4 MG TOTAL) UNDER THE TONGUE EVERY 5 (FIVE) MINUTES AS NEEDED FOR CHEST PAIN. Patient not taking: Reported on 11/05/2017 09/30/17   Lucretia Kern, DO    Current Facility-Administered Medications  Medication Dose Route Frequency Provider Last Rate Last Dose  . 0.9 %  sodium chloride infusion  500 mL Intravenous Once Jackquline Denmark, MD      . 0.9 %  sodium chloride infusion   Intravenous Continuous Amin, Ankit Chirag, MD      . acetaminophen (TYLENOL) tablet 650 mg  650 mg Oral Q6H PRN Amin, Ankit Chirag, MD       Or  . acetaminophen (TYLENOL) suppository 650 mg  650 mg Rectal Q6H PRN Amin, Ankit Chirag, MD      . atorvastatin (LIPITOR) tablet 40 mg  40 mg Oral q1800 Amin, Ankit Chirag, MD      . bisacodyl (DULCOLAX) EC tablet 5 mg  5 mg Oral Daily PRN Amin, Ankit Chirag, MD      . labetalol (NORMODYNE) tablet 200 mg  200 mg Oral TID Amin, Ankit Chirag, MD      . ondansetron (ZOFRAN) tablet 4 mg  4 mg Oral Q6H PRN Amin, Ankit Chirag, MD       Or  . ondansetron (ZOFRAN) injection 4 mg  4 mg Intravenous Q6H PRN Amin, Ankit Chirag, MD      . senna-docusate (Senokot-S) tablet 1 tablet  1 tablet Oral QHS PRN Amin, Jeanella Flattery, MD       Current Outpatient Medications  Medication Sig Dispense Refill  . aspirin 81 MG chewable tablet Chew by mouth daily.    Marland Kitchen atorvastatin (LIPITOR) 80 MG tablet TAKE 1 TABLET EVERY DAY *NEED APPT* 30 tablet 5  . labetalol (NORMODYNE) 200 MG tablet TAKE 1 TABLET BY MOUTH THREE TIMES A DAY 90 tablet 0  . lisinopril (PRINIVIL,ZESTRIL) 40 MG tablet TAKE 1 TABLET BY MOUTH EVERY DAY 90 tablet 1  . Multiple Vitamin (MULTI-VITAMINS) TABS Take by mouth.    . niacin (NIASPAN) 500 MG CR tablet Take 1 tablet (500 mg total) by mouth at bedtime. 30 tablet 0  . nitroGLYCERIN (NITROSTAT) 0.4 MG SL tablet PLACE 1 TABLET (0.4 MG TOTAL) UNDER THE  TONGUE EVERY 5 (FIVE) MINUTES AS NEEDED FOR CHEST PAIN. (Patient not taking: Reported on 11/05/2017) 25 tablet 0    Allergies as of 11/05/2017  . (No Known Allergies)    Family History  Problem Relation Age of Onset  . Heart disease Mother   . Hyperlipidemia Mother   . Hypertension Mother   . Diabetes Mother   . Diabetes Brother   . Colon cancer Neg Hx   . Colon polyps Neg Hx   . Esophageal cancer Neg Hx   . Rectal cancer Neg Hx   . Stomach cancer Neg Hx  Social History   Socioeconomic History  . Marital status: Married    Spouse name: Not on file  . Number of children: Not on file  . Years of education: Not on file  . Highest education level: Not on file  Occupational History  . Not on file  Social Needs  . Financial resource strain: Not on file  . Food insecurity:    Worry: Not on file    Inability: Not on file  . Transportation needs:    Medical: Not on file    Non-medical: Not on file  Tobacco Use  . Smoking status: Former Smoker    Types: Cigarettes    Last attempt to quit: 05/05/1991    Years since quitting: 26.5  . Smokeless tobacco: Never Used  Substance and Sexual Activity  . Alcohol use: Not Currently    Frequency: Never  . Drug use: No  . Sexual activity: Not on file  Lifestyle  . Physical activity:    Days per week: Not on file    Minutes per session: Not on file  . Stress: Not on file  Relationships  . Social connections:    Talks on phone: Not on file    Gets together: Not on file    Attends religious service: Not on file    Active member of club or organization: Not on file    Attends meetings of clubs or organizations: Not on file    Relationship status: Not on file  . Intimate partner violence:    Fear of current or ex partner: Not on file    Emotionally abused: Not on file    Physically abused: Not on file    Forced sexual activity: Not on file  Other Topics Concern  . Not on file  Social History Narrative   Work or School:  Insurance claims handler Situation: lives with wife and son      Spiritual Beliefs: none      Lifestyle: CV exercise 10 minutes per day, healthy             Review of Systems: All systems reviewed and negative except where noted in HPI.  Physical Exam: Vital signs in last 24 hours: Temp:  [97.7 F (36.5 C)-98.9 F (37.2 C)] 97.7 F (36.5 C) (09/27 1308) Pulse Rate:  [45-74] 45 (09/27 1415) Resp:  [12-24] 17 (09/27 1415) BP: (109-167)/(59-90) 156/80 (09/27 1415) SpO2:  [97 %-100 %] 100 % (09/27 1415) Weight:  [77.3 kg] 77.3 kg (09/27 1053)   General:   Alert, well-developed,  male in NAD Psych:  Pleasant, cooperative. Normal mood and affect. Eyes:  Pupils equal, sclera clear, no icterus.   Conjunctiva pink. Ears:  Normal auditory acuity. Nose:  No deformity, discharge,  or lesions. Neck:  Supple; no masses Lungs:  Clear throughout to auscultation.   No wheezes, crackles, or rhonchi.  Heart:  Regular rate and rhythm; no murmurs, no edema Abdomen:  Soft, non-distended, nontender, BS active, no palp mass    Rectal:  Deferred  Msk:  Symmetrical without gross deformities. . Neurologic:  Alert and  oriented x4;  grossly normal neurologically. Skin:  Intact without significant lesions or rashes..   Intake/Output from previous day: No intake/output data recorded. Intake/Output this shift: No intake/output data recorded.  Lab Results: Recent Labs    11/05/17 1328  WBC 6.9  HGB 15.3  HCT 48.2  PLT 203     Studies/Results: No results found.   Nevin Bloodgood  Chester Holstein, NP-C @  11/05/2017, 3:02 PM

## 2017-11-05 NOTE — Progress Notes (Addendum)
Pt status post large 30 mm sigmoid polypectomy complicated by post polypectomy bleed  with   2 cc of epi injected around site and 3 hemostatus clips applied.  Per Dr. Lyndel Safe pt to transported to [facility] for 24 hour observation.  Pt is going to be transported EMS and going to [facility] [place] d/t hx of a MI.  Dr. Lyndel Safe spoke with pt, his son, [name] and also the [employer] with the finding and pt being transported to the hospital.  VVS while in the recovery room.  Pt denies pain, his abdomin is soft and he is passing flatus.  See transfer form. Pt discharged with EMS at 12:34.   [facility]

## 2017-11-05 NOTE — ED Notes (Signed)
Attempted report 

## 2017-11-05 NOTE — H&P (Signed)
History and Physical    [name] [name] VZD:[id number] DOB: 03-20-1950 DOA: 11/05/2017  PCP: Lucretia Kern, DO Patient coming from: Outpatient GI office- [place]  Chief Complaint: Lower GI bleeding  HPI: [name] [name] is a 67 y.o. male with medical history significant of history of CAD with status post MI in 2005, hypertension, hyperlipidemia, left knee replacement was sent to the hospital from gastroenterology office, Dr Lyndel Safe from [facility] for concerns of post polypectomy lower GI bleeding.  Patient went to his routine colonoscopy appointment this morning and had 2 polyps removed.  One was 6 mm and the other was 20 mm.  Clips were placed and the area was injected with epinephrine.  Afterwards he developed bright red blood per rectum therefore sent to the hospital. Patient denies any abdominal pain.  He has stopped taking aspirin several months ago.  Continues to take rest of his medications.  In the ER patient is hemodynamically stable but it is recommended that we observe him in the hospital for worsening of his bleeding.  When I saw the patient he was quite stable.  Interpreter is present at bedside.  Patient's son is also present at bedside.   Review of Systems: As per HPI otherwise 10 point review of systems negative.  Review of Systems Otherwise negative except as per HPI, including: General: Denies fever, chills, night sweats or unintended weight loss. Resp: Denies cough, wheezing, shortness of breath. Cardiac: Denies chest pain, palpitations, orthopnea, paroxysmal nocturnal dyspnea. GI: Denies abdominal pain, nausea, vomiting, diarrhea or constipation GU: Denies dysuria, frequency, hesitancy or incontinence MS: Denies muscle aches, joint pain or swelling Neuro: Denies headache, neurologic deficits (focal weakness, numbness, tingling), abnormal gait Psych: Denies anxiety, depression, SI/HI/AVH Skin: Denies new rashes or lesions ID: Denies sick contacts, exotic exposures,  travel  Past Medical History:  Diagnosis Date  . ABNORMAL EKG 12/20/2008   Qualifier: Diagnosis of  By: Haroldine Laws, MD, Eileen Stanford Arthritis    left wrist  . Hx of coronary artery disease   . Hyperlipidemia   . Hypertension   . Myocardial infarction (lateral wall) [facility] May 2000   inferior lateral wall myocardial infarction    Past Surgical History:  Procedure Laterality Date  . ANGIOPLASTY    . CARDIAC CATHETERIZATION      LAD 60-70% mid lesion, ramus 30% ostial lesion, circumflex was dominant with an occluded obtuse marginal branch which was angioplastied.  EF was 55%.   . 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 His smoking use included cigarettes. He has never used smokeless tobacco. He reports that he drank alcohol. He reports that he does not use drugs.  No Known Allergies  FAMILY HISTORY: Family History  Problem Relation Age of Onset  . Heart disease Mother   . Hyperlipidemia Mother   . Hypertension Mother   . Diabetes Mother   . Diabetes Brother   . Colon cancer Neg Hx   . Colon polyps Neg Hx   . Esophageal cancer Neg Hx   . Rectal cancer Neg Hx   . Stomach cancer Neg Hx      Prior to Admission medications   Medication Sig Start Date End Date Taking? Authorizing Provider  aspirin 81 MG chewable tablet Chew by mouth daily.    [provider]  atorvastatin (LIPITOR) 80 MG tablet TAKE 1 TABLET EVERY DAY *NEED APPT* 06/08/17  Colin Benton R, DO  labetalol (NORMODYNE) 200 MG tablet TAKE 1 TABLET BY MOUTH THREE TIMES A DAY 10/25/17   Lucretia Kern, DO  lisinopril (PRINIVIL,ZESTRIL) 40 MG tablet TAKE 1 TABLET BY MOUTH EVERY DAY 06/29/17   Lucretia Kern, DO  Multiple Vitamin (MULTI-VITAMINS) TABS Take by mouth. 06/07/14   [provider]  niacin (NIASPAN) 500 MG CR tablet Take 1 tablet (500 mg total) by mouth at bedtime. 08/09/12   Bensimhon, Shaune Pascal, MD    nitroGLYCERIN (NITROSTAT) 0.4 MG SL tablet PLACE 1 TABLET (0.4 MG TOTAL) UNDER THE TONGUE EVERY 5 (FIVE) MINUTES AS NEEDED FOR CHEST PAIN. Patient not taking: Reported on 11/05/2017 09/30/17   Lucretia Kern, DO    Physical Exam: Vitals:   11/05/17 1308 11/05/17 1345 11/05/17 1415  BP: (!) 167/67 (!) 159/78 (!) 156/80  Pulse: (!) 49 (!) 49 (!) 45  Resp: 18 16 17   Temp: 97.7 F (36.5 C)    TempSrc: Oral    SpO2: 100% 100% 100%      Constitutional: NAD, calm, comfortable Eyes: PERRL, lids and conjunctivae normal ENMT: Mucous membranes are moist. Posterior pharynx clear of any exudate or lesions.Normal dentition.  Neck: normal, supple, no masses, no thyromegaly Respiratory: clear to auscultation bilaterally, no wheezing, no crackles. Normal respiratory effort. No accessory muscle use.  Cardiovascular: Regular rate and rhythm, no murmurs / rubs / gallops. No extremity edema. 2+ pedal pulses. No carotid bruits.  Abdomen: no tenderness, no masses palpated. No hepatosplenomegaly. Bowel sounds positive.  Musculoskeletal: no clubbing / cyanosis. No joint deformity upper and lower extremities. Good ROM, no contractures. Normal muscle tone.  Skin: no rashes, lesions, ulcers. No induration Neurologic: CN 2-12 grossly intact. Sensation intact, DTR normal. Strength 5/5 in all 4.  Psychiatric: Normal judgment and insight. Alert and oriented x 3. Normal mood.     Labs on Admission: I have personally reviewed following labs and imaging studies  CBC: Recent Labs  Lab 11/05/17 1328  WBC 6.9  NEUTROABS 4.6  HGB 15.3  HCT 48.2  MCV 88.6  PLT 742   Basic Metabolic Panel: No results for input(s): NA, K, CL, CO2, GLUCOSE, BUN, CREATININE, CALCIUM, MG, PHOS in the last 168 hours. GFR: CrCl cannot be calculated (Patient's most recent lab result is older than the maximum 21 days allowed.). Liver Function Tests: No results for input(s): AST, ALT, ALKPHOS, BILITOT, PROT, ALBUMIN in the last 168  hours. No results for input(s): LIPASE, AMYLASE in the last 168 hours. No results for input(s): AMMONIA in the last 168 hours. Coagulation Profile: No results for input(s): INR, PROTIME in the last 168 hours. Cardiac Enzymes: No results for input(s): CKTOTAL, CKMB, CKMBINDEX, TROPONINI in the last 168 hours. BNP (last 3 results) No results for input(s): PROBNP in the last 8760 hours. HbA1C: No results for input(s): HGBA1C in the last 72 hours. CBG: No results for input(s): GLUCAP in the last 168 hours. Lipid Profile: No results for input(s): CHOL, HDL, LDLCALC, TRIG, CHOLHDL, LDLDIRECT in the last 72 hours. Thyroid Function Tests: No results for input(s): TSH, T4TOTAL, FREET4, T3FREE, THYROIDAB in the last 72 hours. Anemia Panel: No results for input(s): VITAMINB12, FOLATE, FERRITIN, TIBC, IRON, RETICCTPCT in the last 72 hours. Urine analysis: No results found for: COLORURINE, APPEARANCEUR, LABSPEC, PHURINE, GLUCOSEU, HGBUR, BILIRUBINUR, KETONESUR, PROTEINUR, UROBILINOGEN, NITRITE, LEUKOCYTESUR Sepsis Labs: !!!!!!!!!!!!!!!!!!!!!!!!!!!!!!!!!!!!!!!!!!!! @LABRCNTIP (procalcitonin:4,lacticidven:4) )No results found for this or any previous visit (from the past 240 hour(s)).   Radiological Exams on Admission: No  results found.   All images have been reviewed by me personally.  EKG: Independently reviewed.   Assessment/Plan Principal Problem:   Lower GI bleed Active Problems:   Pure hypercholesterolemia   CORONARY ATHEROSCLEROSIS NATIVE CORONARY ARTERY   Essential hypertension, benign   Acute lower GI bleed likely post polypectomy bleeding -We will admit the patient for observation.  Gastroenterology will follow along the patient while he is here.  Had colonoscopy earlier this morning by Dr. Lyndel Safe and had 6 mm polyp which was removed with hot snare, 30 mm polyp in sigmoid colon which was also removed with hot snare.  Epinephrine was injected and 3 clips were placed post  polypectomy. -We will place the patient on clear liquid diet.  Gentle IV fluids - Check routine labs.  Follow CBC. -Provide supportive care.  History of coronary artery disease status post MI in 2005 -Patient stopped taking aspirin sometime ago.  Currently on statin and labetalol.  Which I plan on continuing.  He is chest pain-free.  Essential hypertension -Continue labetalol 200 mg 3 times daily with caution in the setting of lower GI bleeding.  Holding off on lisinopril.  Hyperlipidemia -Continue atorvastatin at bedtime.  History of left knee replacement.    DVT prophylaxis: SCDs Code Status: Full code Family Communication: Son at bedside. Disposition Plan: To be determined Consults called: Gastroneurology aware of the patient's admission Admission status: Observation admission   Time Spent: 65 minutes.  >50% of the time was devoted to discussing the patients care, assessment, plan and disposition with other care givers along with counseling the patient about the risks and benefits of treatment.   Please Note: This patient record was dictated using Editor, commissioning. Chart creation errors have been sought, but may not always have been located. Such creation errors do not reflect on the Standard of Medical Care.   Ankit Arsenio Loader MD [facility] Pager [phone]  If 7PM-7AM, please contact night-coverage [email or web address] Password [email or web address]  11/05/2017, 2:48 PM

## 2017-11-05 NOTE — Op Note (Signed)
[place] Patient Name: [name] [name] Procedure Date: 11/05/2017 11:12 AM MRN: [id number] Endoscopist: Jackquline Denmark , MD Age: 67 Referring MD:  Date of Birth: 1950/04/04 Gender: Male Account #: [id number] Procedure:                Colonoscopy Indications:              Positive Cologuard test Medicines:                Monitored Anesthesia Care Procedure:                Pre-Anesthesia Assessment:                           - Prior to the procedure, a History and Physical                            was performed, and patient medications and                            allergies were reviewed. The patient's tolerance of                            previous anesthesia was also reviewed. The risks                            and benefits of the procedure and the sedation                            options and risks were discussed with the patient.                            All questions were answered, and informed consent                            was obtained. Prior Anticoagulants: The patient has                            taken aspirin, last dose was 7 days prior to                            procedure. ASA Grade Assessment: II - A patient                            with mild systemic disease. After reviewing the                            risks and benefits, the patient was deemed in                            satisfactory condition to undergo the procedure.                           After obtaining informed consent, the colonoscope  was passed under direct vision. Throughout the                            procedure, the patient's blood pressure, pulse, and                            oxygen saturations were monitored continuously. The                            Colonoscope was introduced through the anus and                            advanced to the 2 cm into the ileum. The                            colonoscopy was performed without difficulty.  The                            patient tolerated the procedure well. The quality                            of the bowel preparation was adequate to identify                            polyps. Scope In: 11:16:42 AM Scope Out: 11:56:23 AM Scope Withdrawal Time: 0 hours 36 minutes 7 seconds  Total Procedure Duration: 0 hours 39 minutes 41 seconds  Findings:                 A 6 mm polyp was found in the rectum. The polyp was                            sessile. The polyp was removed with a hot snare.                            Resection and retrieval were complete.                           A 30 mm polyp with thick pedicle was found in the                            distal sigmoid colon, 25 cm from the anal verge.                            The polyp was removed with a hot snare. Resection                            and retrieval were complete. There was active                            pulsatile bleeding. The site was cauterized but the  bleeding continued. 2 cc of 1 in 10,000 epinephrine                            was injected around the site and 3 clips placed at                            the post-polyectomy site with hemostasis. There was                            no bleeding at the end of the procedure.                           A 6 mm polyp was found in the rectum. The polyp was                            sessile. The polyp was removed with a hot snare.                            Resection and retrieval were complete. Complications:            Bleeding from the post-polyectomy site. Estimated Blood Loss:     30 ml Impression:               - Large distal sigmoid pedunculated polyp status                            post polypectomy complicated by post polypectomy                            bleed s/p endoscopic treatment.                           - Small rectal polyp status post polypectomy. Recommendation:           - Due to higher incidence of recurrence  of post                            polypectomy bleeding, would recommend observation                            for 24 hours. I will get in touch with our                            inpatient service.                           - Trend CBC.                           - Clear liquid diet.                           - Continue present medications.                           -  Follow biopsies. Jackquline Denmark, MD 11/05/2017 12:16:01 PM This report has been signed electronically.

## 2017-11-05 NOTE — ED Provider Notes (Signed)
[place] EMERGENCY DEPARTMENT Provider Note   CSN: [id number] Arrival date & time: 11/05/17  1303     History   Chief Complaint Chief Complaint  Patient presents with  . Post-op Problem    HPI [name] [name] is a 67 y.o. male.  The history is provided by the patient. A language interpreter was used.   [name] [name] is a 67 y.o. male who presents to the Emergency Department complaining of post-op problem.  He presents to the emergency department for evaluation of post colonoscopy bleeding. He was undergoing a colonoscopy earlier today when a large polyp was removed and he developed bleeding at the site that was injected with epinephrine. Given the amount of bleeding he had during the procedure and he was transferred to the emergency department for admission for observation. Patient denies any complaints in the department. He denies any headache, chest pain, shortness of breath. He did take an aspirin one week ago. He has a history of prior MI. Past Medical History:  Diagnosis Date  . ABNORMAL EKG 12/20/2008   Qualifier: Diagnosis of  By: Haroldine Laws, MD, Eileen Stanford Arthritis    left wrist  . Colon polyp 10/2017  . Coronary artery disease   . Hx of coronary artery disease   . Hyperlipidemia   . Hypertension   . Myocardial infarction (lateral wall) [facility]) May 2000   inferior lateral wall myocardial infarction    Patient Active Problem List   Diagnosis Date Noted  . Lower GI bleed 11/05/2017  . Essential hypertension, benign 10/28/2012  . Pure hypercholesterolemia 11/23/2008  . CORONARY ATHEROSCLEROSIS NATIVE CORONARY ARTERY 11/23/2008    Past Surgical History:  Procedure Laterality Date  . ANGIOPLASTY    . CARDIAC CATHETERIZATION      LAD 60-70% mid lesion, ramus 30% ostial lesion, circumflex was dominant with an occluded obtuse marginal branch which was angioplastied.  EF was 55%.   . COLONOSCOPY W/ POLYPECTOMY  11/05/2017  .  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 Authorizing Provider  aspirin 81 MG chewable tablet Chew 81 mg by mouth daily.     [provider]  atorvastatin (LIPITOR) 80 MG tablet TAKE 1 TABLET EVERY DAY *NEED APPT* Patient taking differently: Take 80 mg by mouth daily at 6 PM.  06/08/17   Lucretia Kern, DO  labetalol (NORMODYNE) 200 MG tablet TAKE 1 TABLET BY MOUTH THREE TIMES A DAY Patient taking differently: Take 200 mg by mouth 3 (three) times daily.  10/25/17   Lucretia Kern, DO  lisinopril (PRINIVIL,ZESTRIL) 40 MG tablet TAKE 1 TABLET BY MOUTH EVERY DAY Patient taking differently: Take 40 mg by mouth daily.  06/29/17   Lucretia Kern, DO  Multiple Vitamin (MULTI-VITAMINS) TABS Take 1 tablet by mouth daily.  06/07/14   [provider]  niacin (NIASPAN) 500 MG CR tablet Take 1 tablet (500 mg total) by mouth at bedtime. 08/09/12   Bensimhon, Shaune Pascal, MD  nitroGLYCERIN (NITROSTAT) 0.4 MG SL tablet PLACE 1 TABLET (0.4 MG TOTAL) UNDER THE TONGUE EVERY 5 (FIVE) MINUTES AS NEEDED FOR CHEST PAIN. 09/30/17   Lucretia Kern, DO    Family History Family History  Problem Relation Age of Onset  . Heart disease Mother   . Hyperlipidemia Mother   . Hypertension Mother   .  Diabetes Mother   . Diabetes Brother   . Colon cancer Neg Hx   . Colon polyps Neg Hx   . Esophageal cancer Neg Hx   . Rectal cancer Neg Hx   . Stomach cancer Neg Hx     Social History Social History   Tobacco Use  . Smoking status: Former Smoker    Types: Cigarettes    Last attempt to quit: 05/05/1991    Years since quitting: 26.5  . Smokeless tobacco: Never Used  Substance Use Topics  . Alcohol use: Not Currently    Frequency: Never  . Drug use: No     Allergies   Patient has no known allergies.   Review of Systems Review of Systems  All other systems reviewed and are  negative.    Physical Exam Updated Vital Signs BP (!) 152/80 (BP Location: Left Arm)   Pulse (!) 52   Temp 98.2 F (36.8 C) (Oral)   Resp 20   Ht 5' 8.5" (1.74 m)   Wt 77.5 kg   SpO2 99%   BMI 25.60 kg/m   Physical Exam  Constitutional: He is oriented to person, place, and time. He appears well-developed and well-nourished.  HENT:  Head: Normocephalic and atraumatic.  Cardiovascular: Regular rhythm.  No murmur heard. bradycardic  Pulmonary/Chest: Effort normal and breath sounds normal. No respiratory distress.  Abdominal: Soft. There is no tenderness. There is no rebound and no guarding.  Genitourinary:  Genitourinary Comments: Moderate amount of gross blood on rectal exam  Musculoskeletal: He exhibits no edema or tenderness.  Neurological: He is alert and oriented to person, place, and time.  Skin: Skin is warm and dry.  Psychiatric: He has a normal mood and affect. His behavior is normal.  Nursing note and vitals reviewed.    ED Treatments / Results  Labs (all labs ordered are listed, but only abnormal results are displayed) Labs Reviewed  BASIC METABOLIC PANEL - Abnormal; Notable for the following components:      Result Value   CO2 20 (*)    All other components within normal limits  CBC WITH DIFFERENTIAL/PLATELET  HIV ANTIBODY (ROUTINE TESTING W REFLEX)  COMPREHENSIVE METABOLIC PANEL  CBC  PROTIME-INR  APTT  TYPE AND SCREEN  ABO/RH    EKG None  Radiology No results found.  Procedures Procedures (including critical care time)  Medications Ordered in ED Medications  atorvastatin (LIPITOR) tablet 40 mg (has no administration in time range)  labetalol (NORMODYNE) tablet 200 mg (has no administration in time range)  0.9 %  sodium chloride infusion (has no administration in time range)  acetaminophen (TYLENOL) tablet 650 mg (has no administration in time range)    Or  acetaminophen (TYLENOL) suppository 650 mg (has no administration in time range)    senna-docusate (Senokot-S) tablet 1 tablet (has no administration in time range)  bisacodyl (DULCOLAX) EC tablet 5 mg (has no administration in time range)  ondansetron (ZOFRAN) tablet 4 mg (has no administration in time range)    Or  ondansetron (ZOFRAN) injection 4 mg (has no administration in time range)     Initial Impression / Assessment and Plan / ED Course  I have reviewed the triage vital signs and the nursing notes.  Pertinent labs & imaging results that were available during my care of the patient were reviewed by me and considered in my medical decision making (see chart for details).     Pt here for evaluation of rectal bleeding that developed during  polypectomy. He did have an episode of rectal bleeding when he went to urinate on ED arrival. There is some blood present on rectal examination. Patient is asymptomatic in the emergency department. Medicine consulted for observation admission.  Final Clinical Impressions(s) / ED Diagnoses   Final diagnoses:  None    ED Discharge Orders    None       Quintella Reichert, MD 11/05/17 1708

## 2017-11-06 DIAGNOSIS — K922 Gastrointestinal hemorrhage, unspecified: Secondary | ICD-10-CM | POA: Diagnosis not present

## 2017-11-06 LAB — APTT: aPTT: 30 seconds (ref 24–36)

## 2017-11-06 LAB — CBC
HCT: 42.5 % (ref 39.0–52.0)
Hemoglobin: 14.1 g/dL (ref 13.0–17.0)
MCH: 28.6 pg (ref 26.0–34.0)
MCHC: 33.2 g/dL (ref 30.0–36.0)
MCV: 86.2 fL (ref 78.0–100.0)
Platelets: 187 10*3/uL (ref 150–400)
RBC: 4.93 MIL/uL (ref 4.22–5.81)
RDW: 12.5 % (ref 11.5–15.5)
WBC: 7.4 10*3/uL (ref 4.0–10.5)

## 2017-11-06 LAB — PROTIME-INR
INR: 1.07
PROTHROMBIN TIME: 13.8 s (ref 11.4–15.2)

## 2017-11-06 LAB — COMPREHENSIVE METABOLIC PANEL
ALK PHOS: 49 U/L (ref 38–126)
ALT: 29 U/L (ref 0–44)
AST: 25 U/L (ref 15–41)
Albumin: 2.2 g/dL — ABNORMAL LOW (ref 3.5–5.0)
Anion gap: 23 — ABNORMAL HIGH (ref 5–15)
BILIRUBIN TOTAL: 1.3 mg/dL — AB (ref 0.3–1.2)
BUN: 10 mg/dL (ref 8–23)
CALCIUM: 6.9 mg/dL — AB (ref 8.9–10.3)
CO2: 21 mmol/L — ABNORMAL LOW (ref 22–32)
Chloride: 104 mmol/L (ref 98–111)
Creatinine, Ser: 0.99 mg/dL (ref 0.61–1.24)
GFR calc Af Amer: >60 mL/min (ref >60)
GFR calc non Af Amer: >60 mL/min (ref >60)
GLUCOSE: 73 mg/dL (ref 70–99)
Potassium: 4 mmol/L (ref 3.5–5.1)
Sodium: 148 mmol/L — ABNORMAL HIGH (ref 135–145)
TOTAL PROTEIN: 5.3 g/dL — AB (ref 6.5–8.1)

## 2017-11-06 LAB — HIV ANTIBODY (ROUTINE TESTING W REFLEX): HIV Screen 4th Generation wRfx: NONREACTIVE

## 2017-11-06 LAB — LACTIC ACID, PLASMA
Lactic Acid, Venous: 1 mmol/L (ref 0.5–1.9)
Lactic Acid, Venous: 1.4 mmol/L (ref 0.5–1.9)

## 2017-11-06 MED ORDER — SODIUM CHLORIDE 0.9 % IV SOLN
INTRAVENOUS | Status: DC
  Administered 2017-11-06: 09:00:00 via INTRAVENOUS

## 2017-11-06 MED ORDER — ASPIRIN 81 MG PO CHEW: 81.0000 mg | CHEWABLE_TABLET | Freq: Every day | ORAL | Status: AC

## 2017-11-06 MED ORDER — DOCUSATE SODIUM 100 MG PO CAPS: 100.0000 mg | ORAL_CAPSULE | Freq: Every day | ORAL | 0 refills | Status: AC

## 2017-11-06 NOTE — Discharge Summary (Signed)
Physician Discharge Summary  [name] [name] TDD:[id number] DOB: [birth year] DOA: 11/05/2017  PCP: Lucretia Kern, DO  Admit date: 11/05/2017 Discharge date: 11/06/2017  Admitted From: Home  Disposition:  Home  Recommendations for Outpatient Follow-up:  1. Follow up with PCP in 1-2 weeks 2. Please obtain BMP/CBC in one week your next doctors visit.  3. Dr Jackquline Denmark has given patient his cell phone number if any issues arise.  4. Hold ASA for 5 days, Colace orally daily for now to have one soft BM daily.    Home Health: None Equipment/Devices: None Discharge Condition: Stable CODE STATUS: Full code Diet recommendation: GI soft diet  Brief/Interim Summary: 67 y.o.malewith medical history significant ofhistory of CAD with status post MI in 2005, hypertension, hyperlipidemia, left knee replacement was sent to the hospital from gastroenterology office, Dr Lyndel Safe from [facility] concerns of post polypectomy lower GI bleeding. Patient went to his routine colonoscopy appointment this morning and had 2 polyps removed.One was 6 mm and the other was 20 mm. Clips were placed and the area was injected with epinephrine. Afterwards he developed bright red blood per rectum therefore sent to the hospital.  Patient was seen by GI in the hospital and had one episode of bloody bowel movement in the ER.  None since he is been admitted but he has not had any bowel movements.   Patient was seen by gastroenterology and patient did not have any other further bleeding in the hospital.  He appeared little dehydrated after discussion with Dr. Lyndel Safe from gastroenterology he was cleared to be discharged and was advised to hydrate himself orally at home.  He was advised to hold his aspirin for 5 days and also Colace was started.  Dr. Lyndel Safe had given the patient his cell phone number and was advised to contact him directly if it became necessary.  At this point patient has reached maximum benefit from an hospital  stay and stable to be discharged.   Discharge Diagnoses:  Principal Problem:   Lower GI bleed Active Problems:   Pure hypercholesterolemia   CORONARY ATHEROSCLEROSIS NATIVE CORONARY ARTERY   Essential hypertension, benign   GI bleed  Acute lower GI bleed likely post polypectomy bleeding, appears to be improving - The following day this is resolved.  Patient no longer had any more bleeding.  His hemoglobin remained stable.  Patient was seen by gastroenterology the following day and also spoke with Dr. Lyndel Safe over the phone cleared the patient to go home.  Patient was advised to hold his aspirin for 5 days and take Colace as well.   Colonoscopy yesterday by Dr. Lyndel Safe and had 6 mm polyp which was removed with hot snare, 30 mm polyp in sigmoid colon which was also removed with hot snare. Epinephrine was injected and 3 clips were placed post polypectomy.  History of coronary artery disease status post MI in 2005;satble -Patient stopped taking aspirin sometime ago. Currently on statin and labetalol. Which I plan on continuing. He is chest pain-free.  Essential hypertension -Continue labetalol 200 mg 3 times daily with caution in the setting of lower GI bleeding. Holding off on lisinopril.  Hyperlipidemia -Continue atorvastatin at bedtime.  History of left knee replacement.  Stable to be discharged Patient was on SCDs while he was here Full code.  Discharge Instructions   Allergies as of 11/06/2017   No Known Allergies     Medication List    STOP taking these medications   carboxymethylcellulose 0.5 % Soln Commonly known  as:  REFRESH PLUS     TAKE these medications   aspirin 81 MG chewable tablet Chew 1 tablet (81 mg total) by mouth daily. Hold Aspirin for 5 days, then resume What changed:  additional instructions   atorvastatin 80 MG tablet Commonly known as:  LIPITOR TAKE 1 TABLET EVERY DAY *NEED APPT* What changed:  See the new instructions.   docusate  sodium 100 MG capsule Commonly known as:  COLACE Take 1 capsule (100 mg total) by mouth daily.   labetalol 200 MG tablet Commonly known as:  NORMODYNE TAKE 1 TABLET BY MOUTH THREE TIMES A DAY   lisinopril 40 MG tablet Commonly known as:  PRINIVIL,ZESTRIL TAKE 1 TABLET BY MOUTH EVERY DAY   MULTI-VITAMINS Tabs Take 1 tablet by mouth daily.   niacin 500 MG CR tablet Commonly known as:  NIASPAN Take 1 tablet (500 mg total) by mouth at bedtime.   nitroGLYCERIN 0.4 MG SL tablet Commonly known as:  NITROSTAT PLACE 1 TABLET (0.4 MG TOTAL) UNDER THE TONGUE EVERY 5 (FIVE) MINUTES AS NEEDED FOR CHEST PAIN.      Follow-up Information    Lucretia Kern, DO. Schedule an appointment as soon as possible for a visit in 1 week(s).   Specialty:  Family Medicine Contact information: [address] [place] [address] [phone]        Jackquline Denmark, MD. Schedule an appointment as soon as possible for a visit in 1 week(s).   Specialties:  Gastroenterology, Internal Medicine Why:  as needed, call sooner if needed or have questions.  Contact information: [place] [address] [phone]          No Known Allergies  You were cared for by a hospitalist during your hospital stay. If you have any questions about your discharge medications or the care you received while you were in the hospital after you are discharged, you can call the unit and asked to speak with the hospitalist on call if the hospitalist that took care of you is not available. Once you are discharged, your primary care physician will handle any further medical issues. Please note that no refills for any discharge medications will be authorized once you are discharged, as it is imperative that you return to your primary care physician (or establish a relationship with a primary care physician if you do not have one) for your aftercare needs so that they can reassess your need  for medications and monitor your lab values.  Consultations:  Gastroenterology   Procedures/Studies:  No results found.   Subjective: No complaints, feeling better.  No more bleeding.  General = no fevers, chills, dizziness, malaise, fatigue HEENT/EYES = negative for pain, redness, loss of vision, double vision, blurred vision, loss of hearing, sore throat, hoarseness, dysphagia Cardiovascular= negative for chest pain, palpitation, murmurs, lower extremity swelling Respiratory/lungs= negative for shortness of breath, cough, hemoptysis, wheezing, mucus production Gastrointestinal= negative for nausea, vomiting,, abdominal pain, melena, hematemesis Genitourinary= negative for Dysuria, Hematuria, Change in Urinary Frequency MSK = Negative for arthralgia, myalgias, Back Pain, Joint swelling  Neurology= Negative for headache, seizures, numbness, tingling  Psychiatry= Negative for anxiety, depression, suicidal and homocidal ideation Allergy/Immunology= Medication/Food allergy as listed  Skin= Negative for Rash, lesions, ulcers, itching   Discharge Exam: Vitals:   11/06/17 0538 11/06/17 0928  BP: (!) 154/75 (!) 165/81  Pulse: 64 67  Resp: 16   Temp: (!) 97.5 F (36.4 C)   SpO2: 96%  100%   Vitals:   11/05/17 1617 11/05/17 2119 11/06/17 0538 11/06/17 0928  BP: (!) 152/80 129/67 (!) 154/75 (!) 165/81  Pulse: (!) 52 65 64 67  Resp: 20 16 16    Temp: 98.2 F (36.8 C) 98.3 F (36.8 C) (!) 97.5 F (36.4 C)   TempSrc: Oral Oral Oral   SpO2: 99% 98% 96% 100%  Weight: 77.5 kg     Height: 5' 8.5" (1.74 m)       General: Pt is alert, awake, not in acute distress Cardiovascular: RRR, S1/S2 +, no rubs, no gallops Respiratory: CTA bilaterally, no wheezing, no rhonchi Abdominal: Soft, NT, ND, bowel sounds + Extremities: no edema, no cyanosis    The results of significant diagnostics from this hospitalization (including imaging, microbiology, ancillary and laboratory) are listed  below for reference.     Microbiology: No results found for this or any previous visit (from the past 240 hour(s)).   Labs: BNP (last 3 results) No results for input(s): BNP in the last 8760 hours. Basic Metabolic Panel: Recent Labs  Lab 11/05/17 1328 11/06/17 0254  NA 141 148*  K 4.4 4.0  CL 110 104  CO2 20* 21*  GLUCOSE 84 73  BUN 15 10  CREATININE 1.19 0.99  CALCIUM 9.3 6.9*   Liver Function Tests: Recent Labs  Lab 11/06/17 0254  AST 25  ALT 29  ALKPHOS 49  BILITOT 1.3*  PROT 5.3*  ALBUMIN 2.2*   No results for input(s): LIPASE, AMYLASE in the last 168 hours. No results for input(s): AMMONIA in the last 168 hours. CBC: Recent Labs  Lab 11/05/17 1328 11/06/17 0254  WBC 6.9 7.4  NEUTROABS 4.6  --   HGB 15.3 14.1  HCT 48.2 42.5  MCV 88.6 86.2  PLT 203 187   Cardiac Enzymes: No results for input(s): CKTOTAL, CKMB, CKMBINDEX, TROPONINI in the last 168 hours. BNP: Invalid input(s): POCBNP CBG: No results for input(s): GLUCAP in the last 168 hours. D-Dimer No results for input(s): DDIMER in the last 72 hours. Hgb A1c No results for input(s): HGBA1C in the last 72 hours. Lipid Profile No results for input(s): CHOL, HDL, LDLCALC, TRIG, CHOLHDL, LDLDIRECT in the last 72 hours. Thyroid function studies No results for input(s): TSH, T4TOTAL, T3FREE, THYROIDAB in the last 72 hours.  Invalid input(s): FREET3 Anemia work up No results for input(s): VITAMINB12, FOLATE, FERRITIN, TIBC, IRON, RETICCTPCT in the last 72 hours. Urinalysis No results found for: COLORURINE, APPEARANCEUR, LABSPEC, [place], GLUCOSEU, HGBUR, BILIRUBINUR, KETONESUR, PROTEINUR, UROBILINOGEN, NITRITE, LEUKOCYTESUR Sepsis Labs Invalid input(s): PROCALCITONIN,  WBC,  LACTICIDVEN Microbiology No results found for this or any previous visit (from the past 240 hour(s)).   Time coordinating discharge:  I have spent 35 minutes face to face with the patient and on the ward discussing the  patients care, assessment, plan and disposition with other care givers. >50% of the time was devoted counseling the patient about the risks and benefits of treatment/Discharge disposition and coordinating care.   SIGNED:   Damita Lack, MD  [facility] 11/06/2017, 3:08 PM Pager   If 7PM-7AM, please contact night-coverage [email or web address] Password [email or web address]

## 2017-11-06 NOTE — Progress Notes (Signed)
PROGRESS NOTE    [name] [name]  OFB:[id number] DOB: 1951-01-08 DOA: 11/05/2017 PCP: Lucretia Kern, DO   Brief Narrative:   [name] [name] is a 67 y.o. male with medical history significant of history of CAD with status post MI in 2005, hypertension, hyperlipidemia, left knee replacement was sent to the hospital from gastroenterology office, Dr Lyndel Safe from [facility] for concerns of post polypectomy lower GI bleeding.  Patient went to his routine colonoscopy appointment this morning and had 2 polyps removed.  One was 6 mm and the other was 20 mm.  Clips were placed and the area was injected with epinephrine.  Afterwards he developed bright red blood per rectum therefore sent to the hospital.  Patient was seen by GI in the hospital and had one episode of bloody bowel movement in the ER.  None since he is been admitted but he has not had any bowel movements.  The following day his anion gap increased along with sodium levels.  Hemoglobin slightly trended down from 15.3 to 14.1.  Assessment & Plan:   Principal Problem:   Lower GI bleed Active Problems:   Pure hypercholesterolemia   CORONARY ATHEROSCLEROSIS NATIVE CORONARY ARTERY   Essential hypertension, benign   GI bleed  Acute lower GI bleed likely post polypectomy bleeding, appears to be improving -Hemoglobin appears to be stable at this time but trended down from 15.3-14.1.  His anion gap and sodium went up, suspect he might be dehydrated so his hemoglobin may actually be lower. -We will check lactic acid.  Give normal saline 100 cc/h. - Routine labs will be monitored today.  We will recheck CBC again tomorrow. - Appreciate gastroneurology input.  Colonoscopy yesterday by Dr. Lyndel Safe and had 6 mm polyp which was removed with hot snare, 30 mm polyp in sigmoid colon which was also removed with hot snare.  Epinephrine was injected and 3 clips were placed post polypectomy.  History of coronary artery disease status post MI in  2005;satble -Patient stopped taking aspirin sometime ago.  Currently on statin and labetalol.  Which I plan on continuing.  He is chest pain-free.  Essential hypertension -Continue labetalol 200 mg 3 times daily with caution in the setting of lower GI bleeding.  Holding off on lisinopril.  Hyperlipidemia -Continue atorvastatin at bedtime.  History of left knee replacement.   DVT prophylaxis: SCDs Code Status: Full code Family Communication: None at bedside Disposition Plan: Patient requires another day of hospital stay as she will need IV fluids.  Will closely need to monitor his hemoglobin due to slight drop.  I suspect after getting more fluids will drop even further.  His anion gap is 22 now with hyponatremia.  Consultants:   GI  Procedures:   None in hospital  Antimicrobials:   None   Subjective: No complaints, no bowel movements overnight.  Review of Systems Otherwise negative except as per HPI, including: General: Denies fever, chills, night sweats or unintended weight loss. Resp: Denies cough, wheezing, shortness of breath. Cardiac: Denies chest pain, palpitations, orthopnea, paroxysmal nocturnal dyspnea. GI: Denies abdominal pain, nausea, vomiting, diarrhea or constipation GU: Denies dysuria, frequency, hesitancy or incontinence MS: Denies muscle aches, joint pain or swelling Neuro: Denies headache, neurologic deficits (focal weakness, numbness, tingling), abnormal gait Psych: Denies anxiety, depression, SI/HI/AVH Skin: Denies new rashes or lesions ID: Denies sick contacts, exotic exposures, travel  Objective: Vitals:   11/05/17 1445 11/05/17 1617 11/05/17 2119 11/06/17 0538  BP: (!) 161/80 (!) 152/80 129/67 (!) 154/75  Pulse: Marland Kitchen)  48 (!) 52 65 64  Resp: 18 20 16 16   Temp:  98.2 F (36.8 C) 98.3 F (36.8 C) (!) 97.5 F (36.4 C)  TempSrc:  Oral Oral Oral  SpO2: 99% 99% 98% 96%  Weight:  77.5 kg    Height:  5' 8.5" (1.74 m)      Intake/Output  Summary (Last 24 hours) at 11/06/2017 0845 Last data filed at 11/06/2017 0045 Gross per 24 hour  Intake 243.44 ml  Output 1100 ml  Net -856.56 ml   Filed Weights   11/05/17 1617  Weight: 77.5 kg    Examination:  General exam: Appears calm and comfortable, dry mouth. Respiratory system: Clear to auscultation. Respiratory effort normal. Cardiovascular system: S1 & S2 heard, RRR. No JVD, murmurs, rubs, gallops or clicks. No pedal edema. Gastrointestinal system: Abdomen is nondistended, soft and nontender. No organomegaly or masses felt. Normal bowel sounds heard. Central nervous system: Alert and oriented. No focal neurological deficits. Extremities: Symmetric 5 x 5 power. Skin: No rashes, lesions or ulcers Psychiatry: Judgement and insight appear normal. Mood & affect appropriate.     Data Reviewed:   CBC: Recent Labs  Lab 11/05/17 1328 11/06/17 0254  WBC 6.9 7.4  NEUTROABS 4.6  --   HGB 15.3 14.1  HCT 48.2 42.5  MCV 88.6 86.2  PLT 203 161   Basic Metabolic Panel: Recent Labs  Lab 11/05/17 1328 11/06/17 0254  NA 141 148*  K 4.4 4.0  CL 110 104  CO2 20* 21*  GLUCOSE 84 73  BUN 15 10  CREATININE 1.19 0.99  CALCIUM 9.3 6.9*   GFR: Estimated Creatinine Clearance: 71.3 mL/min (by C-G formula based on SCr of 0.99 mg/dL). Liver Function Tests: Recent Labs  Lab 11/06/17 0254  AST 25  ALT 29  ALKPHOS 49  BILITOT 1.3*  PROT 5.3*  ALBUMIN 2.2*   No results for input(s): LIPASE, AMYLASE in the last 168 hours. No results for input(s): AMMONIA in the last 168 hours. Coagulation Profile: Recent Labs  Lab 11/06/17 0254  INR 1.07   Cardiac Enzymes: No results for input(s): CKTOTAL, CKMB, CKMBINDEX, TROPONINI in the last 168 hours. BNP (last 3 results) No results for input(s): PROBNP in the last 8760 hours. HbA1C: No results for input(s): HGBA1C in the last 72 hours. CBG: No results for input(s): GLUCAP in the last 168 hours. Lipid Profile: No results for  input(s): CHOL, HDL, LDLCALC, TRIG, CHOLHDL, LDLDIRECT in the last 72 hours. Thyroid Function Tests: No results for input(s): TSH, T4TOTAL, FREET4, T3FREE, THYROIDAB in the last 72 hours. Anemia Panel: No results for input(s): VITAMINB12, FOLATE, FERRITIN, TIBC, IRON, RETICCTPCT in the last 72 hours. Sepsis Labs: No results for input(s): PROCALCITON, LATICACIDVEN in the last 168 hours.  No results found for this or any previous visit (from the past 240 hour(s)).       Radiology Studies: No results found.      Scheduled Meds: . atorvastatin  40 mg Oral q1800  . labetalol  200 mg Oral TID   Continuous Infusions: . sodium chloride 75 mL/hr at 11/05/17 1757  . sodium chloride       LOS: 0 days   Time spent= 45mins    Ankit Arsenio Loader, MD [facility] Pager [phone]   If 7PM-7AM, please contact night-coverage [email or web address] Password [facility] 11/06/2017, 8:45 AM

## 2017-11-06 NOTE — Plan of Care (Signed)

## 2017-11-06 NOTE — Progress Notes (Signed)
      Patient seen and examined this morning Patient seen and examined this morning Brief note  67 year old admitted with post polypectomy bleed admitted for observation. No further bleeding Hemoglobin has been stable No abdominal pain Labs reviewed Tolerating p.o. Vitals:  Vitals:   11/06/17 0538 11/06/17 0928  BP: (!) 154/75 (!) 165/81  Pulse: 64 67  Resp: 16   Temp: (!) 97.5 F (36.4 C)   SpO2: 96% 100%  On exam: Lungs are clear Abdomen soft nontender sounds present. Labs: CBC Latest Ref Rng & Units 11/06/2017 11/05/2017 04/12/2017  WBC 4.0 - 10.5 K/uL 7.4 6.9 8.0  Hemoglobin 13.0 - 17.0 g/dL 14.1 15.3 15.8  Hematocrit 39.0 - 52.0 % 42.5 48.2 46.2  Platelets 150 - 400 K/uL 187 203 199.0     Plan: -Okay to discharge the patient. -Hold aspirin for 5 days. -I have given him all my contact numbers including my cell number.  He will get in touch with [place] in case of any problems. -Will follow biopsies. -D/w Dr. Reesa Chew (hospitalist taking care of the patient). -Have texted Dr Collene Mares and informed her as well.   Ariyan Brisendine,MD 11/06/2017, 11:27 AM   CC No ref. provider found

## 2017-11-06 NOTE — Progress Notes (Signed)
Nsg Discharge Note  Admit Date:  11/05/2017 Discharge date: 11/06/2017   [name] [name] to be D/C'd Home per MD order.  AVS completed.  Copy for chart, and copy for patient signed, and dated. Patient/caregiver able to verbalize understanding.  Discharge Medication: Allergies as of 11/06/2017   No Known Allergies     Medication List    STOP taking these medications   carboxymethylcellulose 0.5 % Soln Commonly known as:  REFRESH PLUS     TAKE these medications   aspirin 81 MG chewable tablet Chew 1 tablet (81 mg total) by mouth daily. Hold Aspirin for 5 days, then resume What changed:  additional instructions   atorvastatin 80 MG tablet Commonly known as:  LIPITOR TAKE 1 TABLET EVERY DAY *NEED APPT* What changed:  See the new instructions.   docusate sodium 100 MG capsule Commonly known as:  COLACE Take 1 capsule (100 mg total) by mouth daily.   labetalol 200 MG tablet Commonly known as:  NORMODYNE TAKE 1 TABLET BY MOUTH THREE TIMES A DAY   lisinopril 40 MG tablet Commonly known as:  PRINIVIL,ZESTRIL TAKE 1 TABLET BY MOUTH EVERY DAY   MULTI-VITAMINS Tabs Take 1 tablet by mouth daily.   niacin 500 MG CR tablet Commonly known as:  NIASPAN Take 1 tablet (500 mg total) by mouth at bedtime.   nitroGLYCERIN 0.4 MG SL tablet Commonly known as:  NITROSTAT PLACE 1 TABLET (0.4 MG TOTAL) UNDER THE TONGUE EVERY 5 (FIVE) MINUTES AS NEEDED FOR CHEST PAIN.       Discharge Assessment: Vitals:   11/06/17 0538 11/06/17 0928  BP: (!) 154/75 (!) 165/81  Pulse: 64 67  Resp: 16   Temp: (!) 97.5 F (36.4 C)   SpO2: 96% 100%   Skin clean, dry and intact without evidence of skin break down, no evidence of skin tears noted. IV catheter discontinued intact. Site without signs and symptoms of complications - no redness or edema noted at insertion site, patient denies c/o pain - only slight tenderness at site.  Dressing with slight pressure applied.  D/c  Instructions-Education: Discharge instructions given to patient/family with verbalized understanding. D/c education completed with patient/family including follow up instructions, medication list, d/c activities limitations if indicated, with other d/c instructions as indicated by MD - patient able to verbalize understanding, all questions fully answered. Patient instructed to return to ED, call 911, or call MD for any changes in condition.  Patient escorted via [place], and D/C home via private auto.  Hiram Comber, RN 11/06/2017 3:38 PM

## 2017-11-08 ENCOUNTER — Telehealth: Payer: Self-pay | Admitting: *Deleted

## 2017-11-08 ENCOUNTER — Telehealth: Payer: Self-pay

## 2017-11-08 ENCOUNTER — Encounter: Payer: Self-pay | Admitting: Gastroenterology

## 2017-11-08 NOTE — Telephone Encounter (Signed)
  Follow up Call-  Call back number 11/05/2017 11/05/2017  Post procedure Call Back phone  # [phone] speaks Ulyses Amor - son [phone] son - [name] speaks english  Permission to leave phone message Yes Yes  Some recent data might be hidden    Unrecognizable message on voicemail.  No message left. Cesar Alf/Call-back

## 2017-11-08 NOTE — Telephone Encounter (Signed)
  Follow up Call-  Call back number 11/05/2017 11/05/2017  Post procedure Call Back phone  # [phone] speaks [name] [name] - son [phone] son - [name] [name] speaks english  Permission to leave phone message Yes Yes  Some recent data might be hidden     Patient questions:  Do you have a fever, pain , or abdominal swelling? No. Pain Score  0 *  Have you tolerated food without any problems? Yes.    Have you been able to return to your normal activities? Yes.    Do you have any questions about your discharge instructions: Diet   No. Medications  No. Follow up visit  No.  Do you have questions or concerns about your Care? No.  Actions: * If pain score is 4 or above: No action needed, pain <4.  Spoke with pt's son who states that the pt is doing well after his hospital admission.  [name] [name] he was d/c on 11/06/17 in the afternoon.  States he is eating and drinking well, and has had a bowel movement.  States no pain or bleeding.  Advised to be aware of any rectal bleeding and to contact Dr. Lyndel [name] if he experiences any.  Son agreed to plan and said he would notify his dad of our call.

## 2017-11-08 NOTE — Telephone Encounter (Signed)
Unable to reach patient at time of TCM Call. Left message for patient to return call when available.  

## 2017-11-09 ENCOUNTER — Telehealth: Payer: Self-pay | Admitting: Family Medicine

## 2017-11-09 NOTE — Telephone Encounter (Signed)
Copied from [place] [id number]. Topic: General - Other >> Nov 08, 2017  5:32 PM Yvette Rack wrote: Reason for CRM: Pt returned call to office. Pt requests a call back. Cb# [phone]

## 2017-11-09 NOTE — Telephone Encounter (Signed)
Unable to reach patient at time of TCM Call. Left message for patient to return call when available.  

## 2017-11-09 NOTE — Telephone Encounter (Signed)
Copied from [place] [id number]. Topic: General - Other >> Nov 08, 2017  5:32 PM Yvette Rack wrote: Reason for CRM: Pt returned call to office. Pt requests a call back. Cb# [phone]

## 2017-11-09 NOTE — Telephone Encounter (Signed)
Duplicate. See phone note dated for yesterday.

## 2017-11-09 NOTE — Telephone Encounter (Signed)
Transition Care Management Follow-up Telephone Call  Per Discharge Summary: Admit date: 11/05/2017 Discharge date: 11/06/2017  Admitted From: Home  Disposition:  Home  Recommendations for Outpatient Follow-up:  1. Follow up with PCP in 1-2 weeks 2. Please obtain BMP/CBC in one week your next doctors visit.  3. Dr Jackquline Denmark has given patient his cell phone number if any issues arise.  4. Hold ASA for 5 days, Colace orally daily for now to have one soft BM daily.    Home Health: None Equipment/Devices: None Discharge Condition: Stable CODE STATUS: Full code Diet recommendation: GI soft diet  -- Call completed w/ patient's son, [name] (on [place]).   How have you been since you were released from the hospital? "He's been doing really really well. No pain, no blood in the stool. He's been eating regular normal food. We started slow with a soft diet the first day. We heeded the recommendations from the fantastic nurse we had."   Do you understand why you were in the hospital? yes   Do you understand the discharge instructions? yes   Where were you discharged to? Home   Items Reviewed:  Medications reviewed: yes  Allergies reviewed: no  Dietary changes reviewed: yes  Referrals reviewed: yes   Functional Questionnaire:   Activities of Daily Living (ADLs):   He states they are independent in the following: ambulation, bathing and hygiene, feeding, continence, grooming, toileting and dressing States they require assistance with the following: none   Any transportation issues/concerns?: no   Any patient concerns? no   Confirmed importance and date/time of follow-up visits scheduled yes, however son reports patient is doing very well and is tired of doctors at the moment. He requested to just schedule a routine visit in a month or so vs hospital follow-up now. Appt scheduled as requested.  Provider Appointment booked with Dr. Colin Benton 12/20/17 @ 10:45am    Confirmed with patient if condition begins to worsen call PCP or go to the ER.  Patient was given the office number and encouraged to call back with question or concerns.  : yes

## 2017-11-18 ENCOUNTER — Other Ambulatory Visit: Payer: Self-pay | Admitting: Family Medicine

## 2017-11-28 ENCOUNTER — Other Ambulatory Visit: Payer: Self-pay | Admitting: Family Medicine

## 2017-12-19 NOTE — Progress Notes (Signed)
HPI:  Using dictation device. Unfortunately this device frequently misinterprets words/phrases.  Here for CPE: Refuses pneumonia shot.  -Concerns and/or follow up today:   [name] [name] is a pleasant 67 y.o. here for follow up. Chronic medical problems summarized below were reviewed for changes and stability and were updated as needed below. These issues and their treatment remain stable for the most part.  Reports is doing well.  No complaints today.  Reports is walking every day.  Diet "eat too much."  Episode of GI bleeding following a polypectomy.  No bleeding since.  Denies any problems with his bowels or urinary system.. Denies CP, SOB, DOE, treatment intolerance or new symptoms.  )HTN: -medications include: labetolol 271m tid and lisinopril 40 mg daily  2)CAD: -hx inferior lat wall MI in 06/1998, normal myoview per cardiology notes in 02/2008, followed by Dr. BHaroldine [facility]  -medications include: ASA 817mqs, lipitor 80 mg qd, labetolol 20055mid, lisinopril 40 mg daily and niacin 500m66mily (cardiologist ok with stopping niacin per notes, but pt opted to continue) - also has nitrostat  3)HLD: -medications include: lipitor 80mg61mly and niacin 500mg 61my - wants to continue the niacin as feels good taking it -stable  4) s/p L knee replacement:  5)Hx Tubular Adenoma -on colonosocpy 2019 (had GI bleed following polypectomy)  -Diet: variety of foods, balance and well rounded, larger portion sizes -Exercise: no regular exercise -Diabetes and Dyslipidemia Screening: Fasting for labs -Hx of HTN: no -Vaccines: UTD, refuses pneumonia vaccine -sexual activity: yes, male partner, no new partners -wants STI testing, Hep C screening (if born [id number] colon or prstate ca: see FH Last colon cancer screening: colonosocpy 2019 (tubular adenoma) - had post polypectomy admission for GI bleed; 5 year repeat advsised by GI  Last prostate ca screening: Declines  DRE or PSA -Alcohol, Tobacco, drug use: see social history  Review of Systems - no fevers, unintentional weight loss, vision loss, hearing loss, chest pain, sob, hemoptysis, melena, hematochezia, hematuria, genital discharge, changing or concerning skin lesions, bleeding, bruising, loc, thoughts of self harm or SI  Past Medical History:  Diagnosis Date  . ABNORMAL EKG 12/20/2008   Qualifier: Diagnosis of  By: BensimHaroldine LawsFACC, Eileen Stanforditis    left wrist  . Colon polyp 10/2017  . Coronary artery disease   . Hx of coronary artery disease   . Hyperlipidemia   . Hypertension   . Myocardial infarction (lateral wall) (HCC) [facility]2000   inferior lateral wall myocardial infarction    Past Surgical History:  Procedure Laterality Date  . ANGIOPLASTY    . CARDIAC CATHETERIZATION      LAD 60-70% mid lesion, ramus 30% ostial lesion, circumflex was dominant with an occluded obtuse marginal branch which was angioplastied.  EF was 55%.   . COLONOSCOPY W/ POLYPECTOMY  11/05/2017  . 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 Heart disease Mother   . Hyperlipidemia Mother   . Hypertension Mother   . Diabetes Mother   . Diabetes Brother   . Colon cancer Neg Hx   . Colon polyps Neg Hx   . Esophageal cancer Neg Hx   . Rectal cancer Neg Hx   . Stomach cancer Neg Hx     Social History   Socioeconomic History  . Marital status: Married    Spouse name: Not on file  .  Number of children: Not on file  . Years of education: Not on file  . Highest education level: Not on file  Occupational History  . Not on file  Social Needs  . Financial resource strain: Not on file  . Food insecurity:    Worry: Not on file    Inability: Not on file  . Transportation needs:    Medical: Not on file    Non-medical: Not on file  Tobacco Use  . Smoking status: Former Smoker    Types: Cigarettes    Last attempt to quit:  05/05/1991    Years since quitting: 26.6  . Smokeless tobacco: Never Used  Substance and Sexual Activity  . Alcohol use: Not Currently    Frequency: Never  . Drug use: No  . Sexual activity: Not on file  Lifestyle  . Physical activity:    Days per week: Not on file    Minutes per session: Not on file  . Stress: Not on file  Relationships  . Social connections:    Talks on phone: Not on file    Gets together: Not on file    Attends religious service: Not on file    Active member of club or organization: Not on file    Attends meetings of clubs or organizations: Not on file    Relationship status: Not on file  Other Topics Concern  . Not on file  Social History Narrative   Work or School: Insurance claims handler Situation: lives with wife and son      Spiritual Beliefs: none      Lifestyle: CV exercise 10 minutes per day, healthy              Current Outpatient Medications:  .  aspirin 81 MG chewable tablet, Chew 1 tablet (81 mg total) by mouth daily. Hold Aspirin for 5 days, then resume, Disp: , Rfl:  .  atorvastatin (LIPITOR) 80 MG tablet, TAKE 1 TABLET EVERY DAY *NEED APPT*, Disp: 30 tablet, Rfl: 5 .  labetalol (NORMODYNE) 200 MG tablet, TAKE 1 TABLET BY MOUTH THREE TIMES A DAY, Disp: 90 tablet, Rfl: 0 .  lisinopril (PRINIVIL,ZESTRIL) 40 MG tablet, TAKE 1 TABLET BY MOUTH EVERY DAY (Patient taking differently: Take 40 mg by mouth daily. ), Disp: 90 tablet, Rfl: 1 .  Multiple Vitamin (MULTI-VITAMINS) TABS, Take 1 tablet by mouth daily. , Disp: , Rfl:  .  niacin (NIASPAN) 500 MG CR tablet, Take 1 tablet (500 mg total) by mouth at bedtime., Disp: 30 tablet, Rfl: 0 .  nitroGLYCERIN (NITROSTAT) 0.4 MG SL tablet, PLACE 1 TABLET (0.4 MG TOTAL) UNDER THE TONGUE EVERY 5 (FIVE) MINUTES AS NEEDED FOR CHEST PAIN., Disp: 25 tablet, Rfl: 0  Current Facility-Administered Medications:  .  0.9 %  sodium chloride infusion, 500 mL, Intravenous, Once, Jackquline Denmark, MD  EXAM:  Vitals:    12/20/17 1051  BP: 122/80  Pulse: 68  Temp: 97.8 F (36.6 C)  TempSrc: Oral  Weight: 167 lb 14.4 oz (76.2 kg)  Height: '5\' 3"'  (1.6 m)    Estimated body mass index is 29.74 kg/m as calculated from the following:   Height as of this encounter: '5\' 3"'  (1.6 m).   Weight as of this encounter: 167 lb 14.4 oz (76.2 kg).  GENERAL: vitals reviewed and listed below, alert, oriented, appears well hydrated and in no acute distress  HEENT: head atraumatic, PERRLA, normal appearance of eyes, ears, nose and mouth. moist mucus  membranes.  NECK: supple, no masses or lymphadenopathy  LUNGS: clear to auscultation bilaterally, no rales, rhonchi or wheeze  CV: HRRR, no peripheral edema or cyanosis, normal pedal pulses  ABDOMEN: bowel sounds normal, soft, non tender to palpation, no masses, no rebound or guarding  GU: declined  RECTAL: refused  SKIN: no rash or abnormal lesions  MS: normal gait, moves all extremities normally  NEURO: normal gait, speech and thought processing grossly intact, muscle tone grossly intact throughout  PSYCH: normal affect, pleasant and cooperative  ASSESSMENT AND PLAN:  Discussed the following assessment and plan:  PREVENTIVE EXAM: -Discussed and advised all [place] preventive services health task force level A and B recommendations for age, sex and risks. -Advised at least 150 minutes of exercise per week and a healthy diet with avoidance of (less then 1 serving per week) processed foods, white starches, red meat, fast foods and sweets and consisting of: * 5-9 servings of fresh fruits and vegetables (not corn or potatoes) *nuts and seeds, beans *olives and olive oil *lean meats such as fish and white chicken  *whole grains -labs, studies and vaccines per orders this encounter  2. Screening for depression -see epic  3. Essential hypertension, benign -cont current treatment, labs  4. Pure hypercholesterolemia -cont current treatment, labs  5.  Atherosclerosis of native coronary artery of native heart without angina pectoris -cont current treatment -lifestyle recs   Patient Instructions  BEFORE YOU LEAVE: -labs -follow up: 3-4 months   We have ordered labs or studies at this visit. It can take up to 1-2 weeks for results and processing. IF results require follow up or explanation, we will call you with instructions. Clinically stable results will be released to your [facility]. If you have not heard from [place] or cannot find your results in [facility] in 2 weeks please contact our office at [phone].  If you are not yet signed up for [facility], please consider signing up.    We recommend the following healthy lifestyle for LIFE: 1) Small portions. But, make sure to get regular (at least 3 per day), healthy meals and small healthy snacks if needed.  2) Eat a healthy clean diet.   TRY TO EAT: -at least 5-7 servings of low sugar, colorful, and nutrient rich vegetables per day (not corn, potatoes or bananas.) -berries are the best choice if you wish to eat fruit (only eat small amounts if trying to reduce weight)  -lean meets (fish, white meat of chicken or [place]) -vegan proteins for some meals - beans or tofu, whole grains, nuts and seeds -Replace bad fats with good fats - good fats include: fish, nuts and seeds, canola oil, olive oil -small amounts of low fat or non fat dairy -small amounts of100 % whole grains - check the lables -drink plenty of water  AVOID: -SUGAR, sweets, anything with added sugar, corn syrup or sweeteners - must read labels as even foods advertised as "healthy" often are loaded with sugar -if you must have a sweetener, small amounts of stevia may be best -sweetened beverages and artificially sweetened beverages -simple starches (rice, bread, potatoes, pasta, chips, etc - small amounts of 100% whole grains are ok) -red meat, pork, butter -fried foods, fast food, processed food, excessive dairy, eggs and  coconut.  3)Get at least 150 minutes of sweaty aerobic exercise per week.  4)Reduce stress - consider counseling, meditation and relaxation to balance other aspects of your life.   Preventive Care 49 Years and Older, Male Preventive  care refers to lifestyle choices and visits with your health care provider that can promote health and wellness. What does preventive care include?  A yearly physical exam. This is also called an annual well check.  Dental exams once or twice a year.  Routine eye exams. Ask your health care provider how often you should have your eyes checked.  Personal lifestyle choices, including: ? Daily care of your teeth and gums. ? Regular physical activity. ? Eating a healthy diet. ? Avoiding tobacco and drug use. ? Limiting alcohol use. ? Practicing safe sex. ? Taking low doses of aspirin every day. ? Taking vitamin and mineral supplements as recommended by your health care provider. What happens during an annual well check? The services and screenings done by your health care provider during your annual well check will depend on your age, overall health, lifestyle risk factors, and family history of disease. Counseling Your health care provider may ask you questions about your:  Alcohol use.  Tobacco use.  Drug use.  Emotional well-being.  Home and relationship well-being.  Sexual activity.  Eating habits.  History of falls.  Memory and ability to understand (cognition).  Work and work Statistician.  Screening You may have the following tests or measurements:  Height, weight, and BMI.  Blood pressure.  Lipid and cholesterol levels. These may be checked every 5 years, or more frequently if you are over 19 years old.  Skin check.  Lung cancer screening. You may have this screening every year starting at age 33 if you have a 30-pack-year history of smoking and currently smoke or have quit within the past 15 years.  Fecal occult blood  test (FOBT) of the stool. You may have this test every year starting at age 61.  Flexible sigmoidoscopy or colonoscopy. You may have a sigmoidoscopy every 5 years or a colonoscopy every 10 years starting at age 15.  Prostate cancer screening. Recommendations will vary depending on your family history and other risks.  Hepatitis C blood test.  Hepatitis B blood test.  Sexually transmitted disease (STD) testing.  Diabetes screening. This is done by checking your blood sugar (glucose) after you have not eaten for a while (fasting). You may have this done every 1-3 years.  Abdominal aortic aneurysm (AAA) screening. You may need this if you are a current or former smoker.  Osteoporosis. You may be screened starting at age 63 if you are at high risk.  Talk with your health care provider about your test results, treatment options, and if necessary, the need for more tests. Vaccines Your health care provider may recommend certain vaccines, such as:  Influenza vaccine. This is recommended every year.  Tetanus, diphtheria, and acellular pertussis (Tdap, Td) vaccine. You may need a Td booster every 10 years.  Varicella vaccine. You may need this if you have not been vaccinated.  Zoster vaccine. You may need this after age 81.  Measles, mumps, and rubella (MMR) vaccine. You may need at least one dose of MMR if you were born in 1957 or later. You may also need a second dose.  Pneumococcal 13-valent conjugate (PCV13) vaccine. One dose is recommended after age 34.  Pneumococcal polysaccharide (PPSV23) vaccine. One dose is recommended after age 50.  Meningococcal vaccine. You may need this if you have certain conditions.  Hepatitis A vaccine. You may need this if you have certain conditions or if you travel or work in places where you may be exposed to hepatitis A.  Hepatitis  B vaccine. You may need this if you have certain conditions or if you travel or work in places where you may be  exposed to hepatitis B.  Haemophilus influenzae type b (Hib) vaccine. You may need this if you have certain risk factors.  Talk to your health care provider about which screenings and vaccines you need and how often you need them. This information is not intended to replace advice given to you by your health care provider. Make sure you discuss any questions you have with your health care provider. Document Released: 02/22/2015 Document Revised: 10/16/2015 Document Reviewed: 11/27/2014 Elsevier Interactive Patient Education  2018 [employer].     No follow-ups on file.   Lucretia Kern, DO

## 2017-12-20 ENCOUNTER — Other Ambulatory Visit: Payer: Self-pay | Admitting: Family Medicine

## 2017-12-20 ENCOUNTER — Encounter: Payer: Self-pay | Admitting: Family Medicine

## 2017-12-20 ENCOUNTER — Ambulatory Visit (INDEPENDENT_AMBULATORY_CARE_PROVIDER_SITE_OTHER): Payer: PRIVATE HEALTH INSURANCE | Admitting: Family Medicine

## 2017-12-20 VITALS — BP 122/80 | HR 68 | Temp 97.8°F | Ht 63.0 in | Wt 167.9 lb

## 2017-12-20 DIAGNOSIS — Z0000 Encounter for general adult medical examination without abnormal findings: Secondary | ICD-10-CM

## 2017-12-20 DIAGNOSIS — Z1331 Encounter for screening for depression: Secondary | ICD-10-CM

## 2017-12-20 DIAGNOSIS — I2510 Atherosclerotic heart disease of native coronary artery without angina pectoris: Secondary | ICD-10-CM

## 2017-12-20 DIAGNOSIS — I10 Essential (primary) hypertension: Secondary | ICD-10-CM

## 2017-12-20 DIAGNOSIS — E7800 Pure hypercholesterolemia, unspecified: Secondary | ICD-10-CM | POA: Diagnosis not present

## 2017-12-20 LAB — BASIC METABOLIC PANEL
BUN: 19 mg/dL (ref 6–23)
CHLORIDE: 101 meq/L (ref 96–112)
CO2: 27 meq/L (ref 19–32)
CREATININE: 1.15 mg/dL (ref 0.40–1.50)
Calcium: 9.9 mg/dL (ref 8.4–10.5)
GFR: 67.3 mL/min (ref >60.00)
Glucose, Bld: 116 mg/dL — ABNORMAL HIGH (ref 70–99)
POTASSIUM: 4.5 meq/L (ref 3.5–5.1)
Sodium: 137 mEq/L (ref 135–145)

## 2017-12-20 LAB — CBC
HEMATOCRIT: 44.3 % (ref 39.0–52.0)
HEMOGLOBIN: 15.3 g/dL (ref 13.0–17.0)
MCHC: 34.6 g/dL (ref 30.0–36.0)
MCV: 84.2 fl (ref 78.0–100.0)
Platelets: 222 10*3/uL (ref 150.0–400.0)
RBC: 5.26 Mil/uL (ref 4.22–5.81)
RDW: 13.1 % (ref 11.5–15.5)
WBC: 8.6 10*3/uL (ref 4.0–10.5)

## 2017-12-20 LAB — LIPID PANEL
CHOL/HDL RATIO: 3
Cholesterol: 130 mg/dL (ref 0–200)
HDL: 42.1 mg/dL (ref >39.00)
LDL Cholesterol: 65 mg/dL (ref 0–99)
NONHDL: 87.44
Triglycerides: 113 mg/dL (ref 0.0–149.0)
VLDL: 22.6 mg/dL (ref 0.0–40.0)

## 2017-12-20 LAB — HEMOGLOBIN A1C: HEMOGLOBIN A1C: 5.6 % (ref 4.6–6.5)

## 2017-12-20 NOTE — Patient Instructions (Addendum)
BEFORE YOU LEAVE: -labs -follow up: 3-4 months   We have ordered labs or studies at this visit. It can take up to 1-2 weeks for results and processing. IF results require follow up or explanation, we will call you with instructions. Clinically stable results will be released to your [facility]. If you have not heard from [place] or cannot find your results in [facility] in 2 weeks please contact our office at [phone].  If you are not yet signed up for [facility], please consider signing up.    We recommend the following healthy lifestyle for LIFE: 1) Small portions. But, make sure to get regular (at least 3 per day), healthy meals and small healthy snacks if needed.  2) Eat a healthy clean diet.   TRY TO EAT: -at least 5-7 servings of low sugar, colorful, and nutrient rich vegetables per day (not corn, potatoes or bananas.) -berries are the best choice if you wish to eat fruit (only eat small amounts if trying to reduce weight)  -lean meets (fish, white meat of chicken or [place]) -vegan proteins for some meals - beans or tofu, whole grains, nuts and seeds -Replace bad fats with good fats - good fats include: fish, nuts and seeds, canola oil, olive oil -small amounts of low fat or non fat dairy -small amounts of100 % whole grains - check the lables -drink plenty of water  AVOID: -SUGAR, sweets, anything with added sugar, corn syrup or sweeteners - must read labels as even foods advertised as "healthy" often are loaded with sugar -if you must have a sweetener, small amounts of stevia may be best -sweetened beverages and artificially sweetened beverages -simple starches (rice, bread, potatoes, pasta, chips, etc - small amounts of 100% whole grains are ok) -red meat, pork, butter -fried foods, fast food, processed food, excessive dairy, eggs and coconut.  3)Get at least 150 minutes of sweaty aerobic exercise per week.  4)Reduce stress - consider counseling, meditation and relaxation to balance  other aspects of your life.   Preventive Care 67 Years and Older, Male Preventive care refers to lifestyle choices and visits with your health care provider that can promote health and wellness. What does preventive care include?  A yearly physical exam. This is also called an annual well check.  Dental exams once or twice a year.  Routine eye exams. Ask your health care provider how often you should have your eyes checked.  Personal lifestyle choices, including: ? Daily care of your teeth and gums. ? Regular physical activity. ? Eating a healthy diet. ? Avoiding tobacco and drug use. ? Limiting alcohol use. ? Practicing safe sex. ? Taking low doses of aspirin every day. ? Taking vitamin and mineral supplements as recommended by your health care provider. What happens during an annual well check? The services and screenings done by your health care provider during your annual well check will depend on your age, overall health, lifestyle risk factors, and family history of disease. Counseling Your health care provider may ask you questions about your:  Alcohol use.  Tobacco use.  Drug use.  Emotional well-being.  Home and relationship well-being.  Sexual activity.  Eating habits.  History of falls.  Memory and ability to understand (cognition).  Work and work Statistician.  Screening You may have the following tests or measurements:  Height, weight, and BMI.  Blood pressure.  Lipid and cholesterol levels. These may be checked every 5 years, or more frequently if you are over 67 years old.  Skin check.  Lung cancer screening. You may have this screening every year starting at age 67 if you have a 30-pack-year history of smoking and currently smoke or have quit within the past 15 years.  Fecal occult blood test (FOBT) of the stool. You may have this test every year starting at age 67.  Flexible sigmoidoscopy or colonoscopy. You may have a sigmoidoscopy every  5 years or a colonoscopy every 10 years starting at age 67.  Prostate cancer screening. Recommendations will vary depending on your family history and other risks.  Hepatitis C blood test.  Hepatitis B blood test.  Sexually transmitted disease (STD) testing.  Diabetes screening. This is done by checking your blood sugar (glucose) after you have not eaten for a while (fasting). You may have this done every 1-3 years.  Abdominal aortic aneurysm (AAA) screening. You may need this if you are a current or former smoker.  Osteoporosis. You may be screened starting at age 67 if you are at high risk.  Talk with your health care provider about your test results, treatment options, and if necessary, the need for more tests. Vaccines Your health care provider may recommend certain vaccines, such as:  Influenza vaccine. This is recommended every year.  Tetanus, diphtheria, and acellular pertussis (Tdap, Td) vaccine. You may need a Td booster every 10 years.  Varicella vaccine. You may need this if you have not been vaccinated.  Zoster vaccine. You may need this after age 67.  Measles, mumps, and rubella (MMR) vaccine. You may need at least one dose of MMR if you were born in 1957 or later. You may also need a second dose.  Pneumococcal 13-valent conjugate (PCV13) vaccine. One dose is recommended after age 67.  Pneumococcal polysaccharide (PPSV23) vaccine. One dose is recommended after age 67.  Meningococcal vaccine. You may need this if you have certain conditions.  Hepatitis A vaccine. You may need this if you have certain conditions or if you travel or work in places where you may be exposed to hepatitis A.  Hepatitis B vaccine. You may need this if you have certain conditions or if you travel or work in places where you may be exposed to hepatitis B.  Haemophilus influenzae type b (Hib) vaccine. You may need this if you have certain risk factors.  Talk to your health care provider  about which screenings and vaccines you need and how often you need them. This information is not intended to replace advice given to you by your health care provider. Make sure you discuss any questions you have with your health care provider. Document Released: 02/22/2015 Document Revised: 10/16/2015 Document Reviewed: 11/27/2014 Elsevier Interactive Patient Education  [employer].

## 2018-01-25 ENCOUNTER — Other Ambulatory Visit: Payer: Self-pay | Admitting: Family Medicine

## 2018-03-29 ENCOUNTER — Other Ambulatory Visit: Payer: Self-pay | Admitting: Family Medicine

## 2018-04-02 ENCOUNTER — Other Ambulatory Visit: Payer: Self-pay | Admitting: Family Medicine

## 2018-06-02 ENCOUNTER — Other Ambulatory Visit: Payer: Self-pay | Admitting: Family Medicine

## 2018-06-22 ENCOUNTER — Other Ambulatory Visit: Payer: Self-pay | Admitting: Family Medicine

## 2018-06-23 ENCOUNTER — Other Ambulatory Visit: Payer: Self-pay | Admitting: Family Medicine

## 2018-07-02 ENCOUNTER — Other Ambulatory Visit: Payer: Self-pay | Admitting: Family Medicine

## 2018-07-18 ENCOUNTER — Ambulatory Visit: Payer: PRIVATE HEALTH INSURANCE | Admitting: Family Medicine

## 2018-07-28 ENCOUNTER — Encounter: Payer: Self-pay | Admitting: Family Medicine

## 2018-07-28 ENCOUNTER — Other Ambulatory Visit: Payer: Self-pay

## 2018-07-28 ENCOUNTER — Ambulatory Visit (INDEPENDENT_AMBULATORY_CARE_PROVIDER_SITE_OTHER): Payer: PRIVATE HEALTH INSURANCE | Admitting: Family Medicine

## 2018-07-28 VITALS — BP 118/78 | Wt 168.0 lb

## 2018-07-28 DIAGNOSIS — I2510 Atherosclerotic heart disease of native coronary artery without angina pectoris: Secondary | ICD-10-CM

## 2018-07-28 DIAGNOSIS — I10 Essential (primary) hypertension: Secondary | ICD-10-CM

## 2018-07-28 DIAGNOSIS — E7800 Pure hypercholesterolemia, unspecified: Secondary | ICD-10-CM

## 2018-07-28 NOTE — Progress Notes (Signed)
Virtual Visit via Video Note  I connected with   on 07/28/18 at  4:40 PM EDT by a video enabled telemedicine application and verified that I am speaking with the correct person using two identifiers.  Location patient: home Location provider:work or home office Persons participating in the virtual visit: patient, provider  I discussed the limitations of evaluation and management by telemedicine and the availability of in person appointments. The patient expressed understanding and agreed to proceed.   HPI:  [name] [name] is a pleasant 68 y.o. here for follow up. Chronic medical problems summarized below were reviewed for changes. He and his son report he is doing great. Has been exercising and eating well. Has no complaints. They prefer to avoid any office visits for the time being in person given the [place] pandemic. He is monitoring his weight and blood pressure at home. Denies CP, SOB, DOE, treatment intolerance or new symptoms.  1) HTN: -medications include: labetolol 200mg  tid and lisinopril 40 mg daily  2)CAD: -hx inferior lat wall MI in 06/1998, normal myoview per cardiology notes in 02/2008, followed by Dr. Haroldine Laws in Cardiology  -medications include: ASA 81mg  qs, lipitor 80 mg qd, labetolol 200mg  tid, lisinopril 40 mg daily and niacin 500mg  daily (cardiologist ok with stopping niacin per notes, but pt opted to continue) - also has nitrostat  3)HLD: -medications include: lipitor 80mg  daily and niacin 500mg  daily - pt wants to continue the niacin as feels good taking it -stable  4) s/p L knee replacement:  5)Hx Tubular Adenoma -on colonosocpy 2019 (had GI bleed following polypectomy)  Bp 118/87 T 97.8 Wt 168lbs  ROS: See pertinent positives and negatives per HPI.  Past Medical History:  Diagnosis Date  . ABNORMAL EKG 12/20/2008   Qualifier: Diagnosis of  By: Haroldine Laws, MD, Eileen Stanford Arthritis    left wrist  . Colon polyp 10/2017  . Coronary artery  disease   . Hx of coronary artery disease   . Hyperlipidemia   . Hypertension   . Myocardial infarction (lateral wall) [facility]) May 2000   inferior lateral wall myocardial infarction    Past Surgical History:  Procedure Laterality Date  . ANGIOPLASTY    . CARDIAC CATHETERIZATION      LAD 60-70% mid lesion, ramus 30% ostial lesion, circumflex was dominant with an occluded obtuse marginal branch which was angioplastied.  EF was 55%.   . COLONOSCOPY W/ POLYPECTOMY  11/05/2017  . 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 Heart disease Mother   . Hyperlipidemia Mother   . Hypertension Mother   . Diabetes Mother   . Diabetes Brother   . Colon cancer Neg Hx   . Colon polyps Neg Hx   . Esophageal cancer Neg Hx   . Rectal cancer Neg Hx   . Stomach cancer Neg Hx     SOCIAL HX: see hpi   Current Outpatient Medications:  .  aspirin 81 MG chewable tablet, Chew 1 tablet (81 mg total) by mouth daily. Hold Aspirin for 5 days, then resume, Disp: , Rfl:  .  atorvastatin (LIPITOR) 80 MG tablet, TAKE 1 TABLET EVERY DAY *NEED APPT*, Disp: 90 tablet, Rfl: 0 .  labetalol (NORMODYNE) 200 MG tablet, TAKE 1 TABLET BY MOUTH THREE TIMES A DAY, Disp: 90 tablet, Rfl: 0 .  lisinopril (ZESTRIL) 40 MG tablet, TAKE 1 TABLET BY MOUTH EVERY DAY, Disp:  90 tablet, Rfl: 0 .  Multiple Vitamin (MULTI-VITAMINS) TABS, Take 1 tablet by mouth daily. , Disp: , Rfl:  .  niacin (NIASPAN) 500 MG CR tablet, Take 1 tablet (500 mg total) by mouth at bedtime., Disp: 30 tablet, Rfl: 0 .  nitroGLYCERIN (NITROSTAT) 0.4 MG SL tablet, PLACE 1 TABLET (0.4 MG TOTAL) UNDER THE TONGUE EVERY 5 (FIVE) MINUTES AS NEEDED FOR CHEST PAIN., Disp: 25 tablet, Rfl: 0  Current Facility-Administered Medications:  .  0.9 %  sodium chloride infusion, 500 mL, Intravenous, Once, Jackquline Denmark, MD  EXAM:  VITALS per patient if applicable: Bp 644/03 T 97.8 Wt  168lbs  GENERAL: alert, oriented, appears well and in no acute distress  HEENT: atraumatic, conjunttiva clear, no obvious abnormalities on inspection of external nose and ears  NECK: normal movements of the head and neck  LUNGS: on inspection no signs of respiratory distress, breathing rate appears normal, no obvious gross SOB, gasping or wheezing  CV: no obvious cyanosis  MS: moves all visible extremities without noticeable abnormality  PSYCH/NEURO: pleasant and cooperative, no obvious depression or anxiety, speech and thought processing grossly intact  ASSESSMENT AND PLAN:  Discussed the following assessment and plan:  Essential hypertension, benign - Plan:   Pure hypercholesterolemia   Atherosclerosis of native coronary artery of native heart without angina pectoris -  -reviewed all medications -advised healthy diet and regular aerobic exercise -they declined labs in light of the pandemic and prefer to postpone for now -follow up virtual in 3 months, In office if the feel comfortable for CPE and labs in november   I discussed the assessment and treatment plan with the patient. The patient was provided an opportunity to ask questions and all were answered. The patient agreed with the plan and demonstrated an understanding of the instructions.    Follow up instructions: Advised assistant Wendie Simmer to help patient arrange the following: -follow up with Dr. Maudie Mercury in 3 months  Lucretia Kern, DO

## 2018-08-08 ENCOUNTER — Other Ambulatory Visit: Payer: Self-pay | Admitting: Family Medicine

## 2018-09-16 ENCOUNTER — Other Ambulatory Visit: Payer: Self-pay | Admitting: Family Medicine

## 2018-11-10 ENCOUNTER — Ambulatory Visit (INDEPENDENT_AMBULATORY_CARE_PROVIDER_SITE_OTHER): Payer: PRIVATE HEALTH INSURANCE | Admitting: Family Medicine

## 2018-11-10 ENCOUNTER — Other Ambulatory Visit: Payer: Self-pay

## 2018-11-10 VITALS — BP 129/78 | Temp 98.1°F | Wt 167.8 lb

## 2018-11-10 DIAGNOSIS — I10 Essential (primary) hypertension: Secondary | ICD-10-CM

## 2018-11-10 DIAGNOSIS — E7800 Pure hypercholesterolemia, unspecified: Secondary | ICD-10-CM

## 2018-11-10 DIAGNOSIS — I2510 Atherosclerotic heart disease of native coronary artery without angina pectoris: Secondary | ICD-10-CM | POA: Diagnosis not present

## 2018-11-10 NOTE — Patient Instructions (Signed)
Continue a healthy diet and regular aerobic exercise  Continue your medications  Please get you flu shot and send [place] a copy so can update in our system  AWV is due in a few months. Please call if our schedulers do not reach out to schedule this.

## 2018-11-10 NOTE — Progress Notes (Signed)
Virtual Visit via Video Note  I connected with [name] [name]  on 11/10/18 at  4:40 PM EDT by a video enabled telemedicine application and verified that I am speaking with the correct person using two identifiers.  Location patient: home Location provider:work or home office Persons participating in the virtual visit: patient, provider  I discussed the limitations of evaluation and management by telemedicine and the availability of in person appointments. The patient expressed understanding and agreed to proceed.   HPI:  Seen via virtual visit per patient preference for follow up. Reports doing well. Continues to get at least 20-30 minutes of aerobic exercise daily. Reviewed medication list in detail - taking all medications as prescribed. Has not required any ntg. He gets his flu shot at work and it is scheduled in 2 weeks. Updated HM. Cronic medical conditions updated below as needed. No reports illness, CP, SOB or other issues.  1)HTN: -medications include: labetolol 200mg  tid and lisinopril 40 mg daily  2)CAD: -hx inferior lat wall MI in 06/1998, normal myoview per cardiology notes in 02/2008, followed by Dr. Haroldine Laws in Cardiology  -medications include: ASA 81mg  qs, lipitor 80 mg qd, labetolol 200mg  tid, lisinopril 40 mg daily and niacin 500mg  daily (cardiologist ok with stopping niacin per notes, but pt opted to continue) - also has nitrostat  3)HLD: -medications include: lipitor 80mg  daily and niacin 500mg  daily - wants to continue the niacin as feels good taking it -stable  4) s/p L knee replacement:  5)Hx Tubular Adenoma -on colonosocpy 2019 (had GI bleed following polypectomy) -due fore repeat colonoscopy 2022  ROS: See pertinent positives and negatives per HPI.  Past Medical History:  Diagnosis Date  . ABNORMAL EKG 12/20/2008   Qualifier: Diagnosis of  By: Haroldine Laws, MD, Eileen Stanford Arthritis    left wrist  . Colon polyp 10/2017  . Coronary artery disease   .  Hx of coronary artery disease   . Hyperlipidemia   . Hypertension   . Myocardial infarction (lateral wall) [facility]) May 2000   inferior lateral wall myocardial infarction    Past Surgical History:  Procedure Laterality Date  . ANGIOPLASTY    . CARDIAC CATHETERIZATION      LAD 60-70% mid lesion, ramus 30% ostial lesion, circumflex was dominant with an occluded obtuse marginal branch which was angioplastied.  EF was 55%.   . COLONOSCOPY W/ POLYPECTOMY  11/05/2017  . 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 Heart disease Mother   . Hyperlipidemia Mother   . Hypertension Mother   . Diabetes Mother   . Diabetes Brother   . Colon cancer Neg Hx   . Colon polyps Neg Hx   . Esophageal cancer Neg Hx   . Rectal cancer Neg Hx   . Stomach cancer Neg Hx     SOCIAL HX: see hpi   Current Outpatient Medications:  .  aspirin 81 MG chewable tablet, Chew 1 tablet (81 mg total) by mouth daily. Hold Aspirin for 5 days, then resume, Disp: , Rfl:  .  atorvastatin (LIPITOR) 80 MG tablet, TAKE 1 TABLET BY MOUTH EVERY DAY, Disp: 90 tablet, Rfl: 0 .  labetalol (NORMODYNE) 200 MG tablet, TAKE 1 TABLET BY MOUTH THREE TIMES A DAY, Disp: 90 tablet, Rfl: 2 .  lisinopril (ZESTRIL) 40 MG tablet, TAKE 1 TABLET BY MOUTH EVERY DAY, Disp: 90 tablet, Rfl: 0 .  Multiple  Vitamin (MULTI-VITAMINS) TABS, Take 1 tablet by mouth daily. , Disp: , Rfl:  .  niacin (NIASPAN) 500 MG CR tablet, Take 1 tablet (500 mg total) by mouth at bedtime., Disp: 30 tablet, Rfl: 0 .  nitroGLYCERIN (NITROSTAT) 0.4 MG SL tablet, PLACE 1 TABLET (0.4 MG TOTAL) UNDER THE TONGUE EVERY 5 (FIVE) MINUTES AS NEEDED FOR CHEST PAIN., Disp: 25 tablet, Rfl: 0  Current Facility-Administered Medications:  .  0.9 %  sodium chloride infusion, 500 mL, Intravenous, Once, Jackquline Denmark, MD  EXAM:  VITALS per patient if applicable: Today's Vitals   11/10/18 1646  BP: 129/78  Temp:  98.1 F (36.7 C)  Weight: 167 lb 12.8 oz (76.1 kg)   Body mass index is 29.72 kg/m.   GENERAL: alert, oriented, appears well and in no acute distress  HEENT: atraumatic, conjunttiva clear, no obvious abnormalities on inspection of external nose and ears  NECK: normal movements of the head and neck  LUNGS: on inspection no signs of respiratory distress, breathing rate appears normal, no obvious gross SOB, gasping or wheezing  CV: no obvious cyanosis  MS: moves all visible extremities without noticeable abnormality  PSYCH/NEURO: pleasant and cooperative, no obvious depression or anxiety, speech and thought processing grossly intact  ASSESSMENT AND PLAN:  Discussed the following assessment and plan:  Essential hypertension, benign  Pure hypercholesterolemia  Atherosclerosis of native coronary artery of native heart without angina pectoris  - reports doing well and seems to be quite stable -he and son request to wait on labs and do with CPE in January - they prefer January due to work schedule, declined to do sooner -advised continuation of current medication, healthy diet, exercise in the interim -needs TOC with Dr. Ethlyn Gallery - they prefer to do this in January to do labs visit the same day, they may do AWV then or separate - note sent to schedulers to assist   I discussed the assessment and treatment plan with the patient. The patient was provided an opportunity to ask questions and all were answered. The patient agreed with the plan and demonstrated an understanding of the instructions.   The patient was advised to call back or seek an in-person evaluation if the symptoms worsen or if the condition fails to improve as anticipated.   Lucretia Kern, DO   Patient Instructions  Continue a healthy diet and regular aerobic exercise  Continue your medications  Please get you flu shot and send [place] a copy so can update in our system  AWV is due in a few months. Please call if  our schedulers do not reach out to schedule this.

## 2018-11-17 ENCOUNTER — Telehealth: Payer: Self-pay | Admitting: Family Medicine

## 2018-11-17 NOTE — Telephone Encounter (Signed)
Called patient to set up a TOC appointment for January.  LVM for pt to return call.

## 2018-11-20 ENCOUNTER — Other Ambulatory Visit: Payer: Self-pay | Admitting: Family Medicine

## 2018-12-04 ENCOUNTER — Other Ambulatory Visit: Payer: Self-pay | Admitting: Family Medicine

## 2018-12-12 ENCOUNTER — Other Ambulatory Visit: Payer: Self-pay | Admitting: Family Medicine

## 2018-12-22 ENCOUNTER — Other Ambulatory Visit: Payer: Self-pay | Admitting: Family Medicine

## 2019-03-13 ENCOUNTER — Encounter: Payer: PRIVATE HEALTH INSURANCE | Admitting: Family Medicine

## 2019-03-15 ENCOUNTER — Ambulatory Visit (INDEPENDENT_AMBULATORY_CARE_PROVIDER_SITE_OTHER): Payer: PRIVATE HEALTH INSURANCE | Admitting: Family Medicine

## 2019-03-15 ENCOUNTER — Other Ambulatory Visit: Payer: Self-pay

## 2019-03-15 ENCOUNTER — Encounter: Payer: Self-pay | Admitting: Family Medicine

## 2019-03-15 VITALS — BP 100/70 | HR 60 | Temp 97.2°F | Ht 63.0 in | Wt 166.6 lb

## 2019-03-15 DIAGNOSIS — I10 Essential (primary) hypertension: Secondary | ICD-10-CM | POA: Diagnosis not present

## 2019-03-15 DIAGNOSIS — E7800 Pure hypercholesterolemia, unspecified: Secondary | ICD-10-CM | POA: Diagnosis not present

## 2019-03-15 DIAGNOSIS — I2510 Atherosclerotic heart disease of native coronary artery without angina pectoris: Secondary | ICD-10-CM | POA: Diagnosis not present

## 2019-03-15 DIAGNOSIS — Z0000 Encounter for general adult medical examination without abnormal findings: Secondary | ICD-10-CM

## 2019-03-15 DIAGNOSIS — L989 Disorder of the skin and subcutaneous tissue, unspecified: Secondary | ICD-10-CM

## 2019-03-15 DIAGNOSIS — R739 Hyperglycemia, unspecified: Secondary | ICD-10-CM

## 2019-03-15 LAB — COMPREHENSIVE METABOLIC PANEL
ALT: 35 U/L (ref 0–53)
AST: 29 U/L (ref 0–37)
Albumin: 4.5 g/dL (ref 3.5–5.2)
Alkaline Phosphatase: 74 U/L (ref 39–117)
BUN: 20 mg/dL (ref 6–23)
CO2: 27 mEq/L (ref 19–32)
Calcium: 9.2 mg/dL (ref 8.4–10.5)
Chloride: 103 mEq/L (ref 96–112)
Creatinine, Ser: 1.1 mg/dL (ref 0.40–1.50)
GFR: 66.41 mL/min (ref >60.00)
Glucose, Bld: 119 mg/dL — ABNORMAL HIGH (ref 70–99)
Potassium: 4.5 mEq/L (ref 3.5–5.1)
Sodium: 138 mEq/L (ref 135–145)
Total Bilirubin: 0.7 mg/dL (ref 0.2–1.2)
Total Protein: 7.1 g/dL (ref 6.0–8.3)

## 2019-03-15 LAB — TSH: TSH: 1.36 u[IU]/mL (ref 0.35–4.50)

## 2019-03-15 LAB — CBC WITH DIFFERENTIAL/PLATELET
Basophils Absolute: 0 10*3/uL (ref 0.0–0.1)
Basophils Relative: 0.2 % (ref 0.0–3.0)
Eosinophils Absolute: 0.1 10*3/uL (ref 0.0–0.7)
Eosinophils Relative: 1.4 % (ref 0.0–5.0)
HCT: 45.4 % (ref 39.0–52.0)
Hemoglobin: 15.2 g/dL (ref 13.0–17.0)
Lymphocytes Relative: 24 % (ref 12.0–46.0)
Lymphs Abs: 1.4 10*3/uL (ref 0.7–4.0)
MCHC: 33.6 g/dL (ref 30.0–36.0)
MCV: 84.3 fl (ref 78.0–100.0)
Monocytes Absolute: 0.7 10*3/uL (ref 0.1–1.0)
Monocytes Relative: 12.3 % — ABNORMAL HIGH (ref 3.0–12.0)
Neutro Abs: 3.6 10*3/uL (ref 1.4–7.7)
Neutrophils Relative %: 62.1 % (ref 43.0–77.0)
Platelets: 185 10*3/uL (ref 150.0–400.0)
RBC: 5.39 Mil/uL (ref 4.22–5.81)
RDW: 12.8 % (ref 11.5–15.5)
WBC: 5.8 10*3/uL (ref 4.0–10.5)

## 2019-03-15 LAB — HEMOGLOBIN A1C: Hgb A1c MFr Bld: 5.7 % (ref 4.6–6.5)

## 2019-03-15 LAB — LIPID PANEL
Cholesterol: 120 mg/dL (ref 0–200)
HDL: 37.1 mg/dL — ABNORMAL LOW (ref >39.00)
LDL Cholesterol: 61 mg/dL (ref 0–99)
NonHDL: 82.86
Total CHOL/HDL Ratio: 3
Triglycerides: 110 mg/dL (ref 0.0–149.0)
VLDL: 22 mg/dL (ref 0.0–40.0)

## 2019-03-15 MED ORDER — NIACIN ER (ANTIHYPERLIPIDEMIC) 500 MG PO TBCR: 500.0000 mg | EXTENDED_RELEASE_TABLET | Freq: Every day | ORAL | 1 refills | Status: DC

## 2019-03-15 MED ORDER — ATORVASTATIN CALCIUM 80 MG PO TABS: 80.0000 mg | ORAL_TABLET | Freq: Every day | ORAL | 1 refills | Status: DC

## 2019-03-15 MED ORDER — LISINOPRIL 40 MG PO TABS: 40.0000 mg | ORAL_TABLET | Freq: Every day | ORAL | 1 refills | Status: DC

## 2019-03-15 MED ORDER — LABETALOL HCL 200 MG PO TABS: 200.0000 mg | ORAL_TABLET | Freq: Three times a day (TID) | ORAL | 1 refills | Status: DC

## 2019-03-15 MED ORDER — NITROGLYCERIN 0.4 MG SL SUBL: 0.4000 mg | SUBLINGUAL_TABLET | SUBLINGUAL | 1 refills | Status: DC | PRN

## 2019-03-15 NOTE — Addendum Note (Signed)
Addended by: Zacarias Pontes on: 03/15/2019 09:53 AM   Modules accepted: Orders

## 2019-03-15 NOTE — Progress Notes (Addendum)
[name] [name] DOB: 11/03/1950 Encounter date: 03/15/2019  This is a 69 y.o. male who presents for complete physical   History of present illness/Additional concerns: No worries today.   1)HTN: -medications include: labetolol 200mg  tid and lisinopril 40 mg daily. Does check at home. Usually runs well.   2)CAD: -hx inferior lat wall MI in 06/1998, normal myoview per cardiology notes in 02/2008, followed by Dr. Haroldine Laws in Cardiology but hasn't followed due to feeling well.   -medications include: ASA 81mg  qs, lipitor 80 mg qd, labetolol 200mg  tid, lisinopril 40 mg daily and niacin 500mg  daily (cardiologist ok with stopping niacin per notes, but pt opted to continue) - also has nitrostat  3)HLD: -medications include: lipitor 80mg  daily and niacin 500mg  daily - wants to continue the niacin as feels good taking it -stable  5)Hx Tubular Adenoma -on colonosocpy 2019 (had GI bleed following polypectomy) -due for repeat colonoscopy 2022   Past Medical History:  Diagnosis Date  . ABNORMAL EKG 12/20/2008   Qualifier: Diagnosis of  By: Haroldine Laws, MD, Eileen Stanford Arthritis    left wrist  . Colon polyp 10/2017  . Coronary artery disease   . Hx of coronary artery disease   . Hyperlipidemia   . Hypertension   . Myocardial infarction (lateral wall) [facility]) May 2000   inferior lateral wall myocardial infarction   Past Surgical History:  Procedure Laterality Date  . ANGIOPLASTY    . CARDIAC CATHETERIZATION      LAD 60-70% mid lesion, ramus 30% ostial lesion, circumflex was dominant with an occluded obtuse marginal branch which was angioplastied.  EF was 55%.   . COLONOSCOPY W/ POLYPECTOMY  11/05/2017  . 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 aspirin 81 MG chewable tablet Chew 1 tablet (81 mg total) by mouth daily. Hold Aspirin for 5 days, then resume  . atorvastatin (LIPITOR) 80 MG tablet Take 1  tablet (80 mg total) by mouth daily.  Marland Kitchen labetalol (NORMODYNE) 200 MG tablet Take 1 tablet (200 mg total) by mouth 3 (three) times daily.  Marland Kitchen lisinopril (ZESTRIL) 40 MG tablet Take 1 tablet (40 mg total) by mouth daily.  . Multiple Vitamin (MULTI-VITAMINS) TABS Take 1 tablet by mouth daily.   . niacin (NIASPAN) 500 MG CR tablet Take 1 tablet (500 mg total) by mouth at bedtime.  . nitroGLYCERIN (NITROSTAT) 0.4 MG SL tablet Place 1 tablet (0.4 mg total) under the tongue every 5 (five) minutes as needed for chest pain.  . [DISCONTINUED] atorvastatin (LIPITOR) 80 MG tablet TAKE 1 TABLET BY MOUTH EVERY DAY  . [DISCONTINUED] labetalol (NORMODYNE) 200 MG tablet TAKE 1 TABLET BY MOUTH THREE TIMES A DAY  . [DISCONTINUED] lisinopril (ZESTRIL) 40 MG tablet TAKE 1 TABLET BY MOUTH EVERY DAY  . [DISCONTINUED] niacin (NIASPAN) 500 MG CR tablet Take 1 tablet (500 mg total) by mouth at bedtime.  . [DISCONTINUED] nitroGLYCERIN (NITROSTAT) 0.4 MG SL tablet PLACE 1 TABLET (0.4 MG TOTAL) UNDER THE TONGUE EVERY 5 (FIVE) MINUTES AS NEEDED FOR CHEST PAIN.   Current Facility-Administered Medications for the 03/15/19 encounter (Office Visit) with Caren Macadam, MD  Medication  . 0.9 %  sodium chloride infusion   Social History   Tobacco Use  . Smoking status: Former Smoker    Types: Cigarettes    Quit date: 05/05/1991    Years since quitting: 27.8  . Smokeless tobacco: Never Used  Substance Use Topics  . Alcohol use: Not Currently   Family History  Problem Relation Age of Onset  . Heart disease Mother   . Hyperlipidemia Mother   . Hypertension Mother   . Diabetes Mother   . Diabetes Brother   . Colon cancer Neg Hx   . Colon polyps Neg Hx   . Esophageal cancer Neg Hx   . Rectal cancer Neg Hx   . Stomach cancer Neg Hx      Review of Systems  Constitutional: Negative for activity change, appetite change, chills, fatigue, fever and unexpected weight change.  HENT: Negative for congestion, ear pain,  hearing loss, sinus pressure, sinus pain, sore throat and trouble swallowing.   Eyes: Negative for pain and visual disturbance.  Respiratory: Negative for cough, chest tightness, shortness of breath and wheezing.   Cardiovascular: Negative for chest pain, palpitations and leg swelling.  Gastrointestinal: Negative for abdominal distention, abdominal pain, blood in stool, constipation, diarrhea, nausea and vomiting.  Genitourinary: Negative for decreased urine volume, difficulty urinating, dysuria, penile pain and testicular pain.  Musculoskeletal: Negative for arthralgias, back pain and joint swelling.  Skin: Negative for rash.  Neurological: Negative for dizziness, weakness, numbness and headaches.  Hematological: Negative for adenopathy. Does not bruise/bleed easily.  Psychiatric/Behavioral: Negative for agitation, sleep disturbance and suicidal ideas. The patient is not nervous/anxious.     CBC:  Lab Results  Component Value Date   WBC 8.6 12/20/2017   HGB 15.3 12/20/2017   HGB 15.4 10/20/2016   HCT 44.3 12/20/2017   HCT 46.1 10/20/2016   MCH 28.6 11/06/2017   MCHC 34.6 12/20/2017   RDW 13.1 12/20/2017   RDW 13.5 10/20/2016   PLT 222.0 12/20/2017   PLT 215 10/20/2016   CMP: Lab Results  Component Value Date   NA 137 12/20/2017   NA 140 10/20/2016   K 4.5 12/20/2017   CL 101 12/20/2017   CO2 27 12/20/2017   ANIONGAP 23 (H) 11/06/2017   GLUCOSE 116 (H) 12/20/2017   BUN 19 12/20/2017   BUN 23 10/20/2016   CREATININE 1.15 12/20/2017   LABGLOB 2.6 10/20/2016   GFRAA >60 11/06/2017   CALCIUM 9.9 12/20/2017   PROT 5.3 (L) 11/06/2017   PROT 7.4 10/20/2016   AGRATIO 1.8 10/20/2016   BILITOT 1.3 (H) 11/06/2017   BILITOT 0.8 10/20/2016   ALKPHOS 49 11/06/2017   ALT 29 11/06/2017   AST 25 11/06/2017   LIPID: Lab Results  Component Value Date   CHOL 130 12/20/2017   CHOL 133 10/20/2016   TRIG 113.0 12/20/2017   HDL 42.10 12/20/2017   HDL 45 10/20/2016   LDLCALC 65  12/20/2017   LDLCALC 69 10/20/2016   LABVLDL 19 10/20/2016    Objective:  BP 100/70 (BP Location: Left Arm, Patient Position: Sitting, Cuff Size: Normal)   Pulse 60   Temp (!) 97.2 F (36.2 C) (Temporal)   Ht 5\' 3"  (1.6 m)   Wt 166 lb 9.6 oz (75.6 kg)   SpO2 98%   BMI 29.51 kg/m   Weight: 166 lb 9.6 oz (75.6 kg)   BP Readings from Last 3 Encounters:  03/15/19 100/70  11/10/18 129/78  07/28/18 118/78   Wt Readings from Last 3 Encounters:  03/15/19 166 lb 9.6 oz (75.6 kg)  11/10/18 167 lb 12.8 oz (76.1 kg)  07/28/18 168 lb (76.2 kg)    Physical Exam Constitutional:      General: He is not in acute distress.    Appearance:  He is well-developed.  HENT:     Head: Normocephalic and atraumatic.     Right Ear: External ear normal.     Left Ear: External ear normal.     Nose: Nose normal.     Mouth/Throat:     Pharynx: No oropharyngeal exudate.  Eyes:     Conjunctiva/sclera: Conjunctivae normal.     Pupils: Pupils are equal, round, and reactive to light.  Neck:     Thyroid: No thyromegaly.  Cardiovascular:     Rate and Rhythm: Normal rate and regular rhythm.     Heart sounds: Normal heart sounds. No murmur. No friction rub. No gallop.   Pulmonary:     Effort: Pulmonary effort is normal. No respiratory distress.     Breath sounds: Normal breath sounds. No stridor. No wheezing or rales.  Abdominal:     General: Bowel sounds are normal.     Palpations: Abdomen is soft.  Musculoskeletal:        General: Normal range of motion.     Cervical back: Neck supple.     Comments: Right knee enlargement with effusion. Patient states stable.   Skin:    General: Skin is warm and dry.     Comments: Multiple moles trunk 0.5cm pink/tan papular lesion right cheek; right side nose 82mm papule  Neurological:     Mental Status: He is alert and oriented to person, place, and time.  Psychiatric:        Behavior: Behavior normal.        Thought Content: Thought content normal.         Judgment: Judgment normal.     Assessment/Plan: Health Maintenance Due  Topic Date Due  . PNA vac Low Risk Adult (1 of 2 - PCV13) 05/30/2015   Health Maintenance reviewed - supposed to get COVID vaccine this Saturday; will hold off on pneumonia vaccine today.  1. Preventative health care Keep up with active lifestyle and healthy eating, biking.  2. Pure hypercholesterolemia Will recheck today. Continue with lipitor.  - Lipid panel; Future - TSH; Future  3. Essential hypertension, benign Well controlled. Continue current medications.  - CBC with Differential/Platelet; Future - Comprehensive metabolic panel; Future  4. Atherosclerosis of native coronary artery of native heart without angina pectoris bp and lipids controlled. Consider follow up with cardiology in future.  5. Hyperglycemia - Hemoglobin A1c; Future  6. CORONARY ATHEROSCLEROSIS NATIVE CORONARY ARTERY - nitroGLYCERIN (NITROSTAT) 0.4 MG SL tablet; Place 1 tablet (0.4 mg total) under the tongue every 5 (five) minutes as needed for chest pain.  Dispense: 25 tablet; Refill: 1  7. Skin lesion of face Return for biopsy; gave option for derm referral if desired as well.  Return in about 6 months (around 09/12/2019) for skin lesion removal face prn AND CCV 6 months.  Micheline Rough, MD

## 2019-09-18 ENCOUNTER — Other Ambulatory Visit: Payer: Self-pay

## 2019-09-18 ENCOUNTER — Encounter: Payer: Self-pay | Admitting: Family Medicine

## 2019-09-18 ENCOUNTER — Ambulatory Visit: Payer: PRIVATE HEALTH INSURANCE | Admitting: Family Medicine

## 2019-09-18 VITALS — BP 110/70 | HR 65 | Temp 97.7°F | Ht 63.0 in | Wt 164.0 lb

## 2019-09-18 DIAGNOSIS — I2510 Atherosclerotic heart disease of native coronary artery without angina pectoris: Secondary | ICD-10-CM

## 2019-09-18 DIAGNOSIS — R739 Hyperglycemia, unspecified: Secondary | ICD-10-CM

## 2019-09-18 DIAGNOSIS — E7800 Pure hypercholesterolemia, unspecified: Secondary | ICD-10-CM | POA: Diagnosis not present

## 2019-09-18 DIAGNOSIS — I10 Essential (primary) hypertension: Secondary | ICD-10-CM

## 2019-09-18 DIAGNOSIS — Z23 Encounter for immunization: Secondary | ICD-10-CM

## 2019-09-18 MED ORDER — LABETALOL HCL 200 MG PO TABS: 200.0000 mg | ORAL_TABLET | Freq: Three times a day (TID) | ORAL | 1 refills | Status: DC

## 2019-09-18 MED ORDER — LISINOPRIL 40 MG PO TABS: 40.0000 mg | ORAL_TABLET | Freq: Every day | ORAL | 1 refills | Status: DC

## 2019-09-18 MED ORDER — SHINGRIX 50 MCG/0.5ML IM SUSR
0.5000 mL | Freq: Once | INTRAMUSCULAR | 0 refills | Status: AC
Start: 2019-09-18 — End: 2019-09-18

## 2019-09-18 MED ORDER — NITROGLYCERIN 0.4 MG SL SUBL: 0.4000 mg | SUBLINGUAL_TABLET | SUBLINGUAL | 1 refills | Status: DC | PRN

## 2019-09-18 MED ORDER — ATORVASTATIN CALCIUM 80 MG PO TABS: 80.0000 mg | ORAL_TABLET | Freq: Every day | ORAL | 1 refills | Status: DC

## 2019-09-18 MED ORDER — NIACIN ER (ANTIHYPERLIPIDEMIC) 500 MG PO TBCR: 500.0000 mg | EXTENDED_RELEASE_TABLET | Freq: Every day | ORAL | 1 refills | Status: DC

## 2019-09-18 NOTE — Patient Instructions (Signed)
Get the shingrix vaccine at the pharmacy (I have sent a prescription over for you)

## 2019-09-18 NOTE — Progress Notes (Signed)
[name] [name] DOB: 1950/03/26 Encounter date: 09/18/2019  This is a 69 y.o. male who presents with Chief Complaint  Patient presents with   Follow-up    History of present illness: Last visit was in 03/2019 Hyperlipidemia: On Lipitor, niacin Hypertension: Labetalol 200 mg 3 times daily, lisinopril 40 mg daily CAD: History of inferior lateral wall MI in 06/1998 with normal Myoview in 2010  Due for repeat colonoscopy 2022  No Known Allergies Current Meds  Medication Sig   aspirin 81 MG chewable tablet Chew 1 tablet (81 mg total) by mouth daily. Hold Aspirin for 5 days, then resume   atorvastatin (LIPITOR) 80 MG tablet Take 1 tablet (80 mg total) by mouth daily.   labetalol (NORMODYNE) 200 MG tablet Take 1 tablet (200 mg total) by mouth 3 (three) times daily.   lisinopril (ZESTRIL) 40 MG tablet Take 1 tablet (40 mg total) by mouth daily.   Multiple Vitamin (MULTI-VITAMINS) TABS Take 1 tablet by mouth daily.    niacin (NIASPAN) 500 MG CR tablet Take 1 tablet (500 mg total) by mouth at bedtime.   nitroGLYCERIN (NITROSTAT) 0.4 MG SL tablet Place 1 tablet (0.4 mg total) under the tongue every 5 (five) minutes as needed for chest pain.   [DISCONTINUED] atorvastatin (LIPITOR) 80 MG tablet Take 1 tablet (80 mg total) by mouth daily.   [DISCONTINUED] labetalol (NORMODYNE) 200 MG tablet Take 1 tablet (200 mg total) by mouth 3 (three) times daily.   [DISCONTINUED] lisinopril (ZESTRIL) 40 MG tablet Take 1 tablet (40 mg total) by mouth daily.   [DISCONTINUED] niacin (NIASPAN) 500 MG CR tablet Take 1 tablet (500 mg total) by mouth at bedtime.   [DISCONTINUED] nitroGLYCERIN (NITROSTAT) 0.4 MG SL tablet Place 1 tablet (0.4 mg total) under the tongue every 5 (five) minutes as needed for chest pain.   Current Facility-Administered Medications for the 09/18/19 encounter (Office Visit) with Caren Macadam, MD  Medication   0.9 %  sodium chloride infusion    Review of Systems   Constitutional: Negative for chills, fatigue and fever.  Respiratory: Negative for cough, chest tightness, shortness of breath and wheezing.   Cardiovascular: Negative for chest pain, palpitations and leg swelling.    Objective:  BP 110/70 (BP Location: Left Arm, Patient Position: Sitting, Cuff Size: Normal)    Pulse 65    Temp 97.7 F (36.5 C) (Oral)    Ht 5\' 3"  (1.6 m)    Wt 164 lb (74.4 kg)    BMI 29.05 kg/m   Weight: 164 lb (74.4 kg)   BP Readings from Last 3 Encounters:  09/18/19 110/70  03/15/19 100/70  11/10/18 129/78   Wt Readings from Last 3 Encounters:  09/18/19 164 lb (74.4 kg)  03/15/19 166 lb 9.6 oz (75.6 kg)  11/10/18 167 lb 12.8 oz (76.1 kg)    Physical Exam Constitutional:      General: He is not in acute distress.    Appearance: He is well-developed.  Cardiovascular:     Rate and Rhythm: Normal rate and regular rhythm.     Heart sounds: Normal heart sounds. No murmur heard.  No friction rub.     Comments: Varicose veins right lower extremity.   Pulmonary:     Effort: Pulmonary effort is normal. No respiratory distress.     Breath sounds: Normal breath sounds. No wheezing or rales.  Musculoskeletal:     Right lower leg: No edema.     Left lower leg: No edema.  Comments: Bony enlargement right knee, right mcp first and second joints  Skin:    Comments: Pink papule right nares 34mm (patient states there x 30 years); pink irregular papule 2.64mm right cheek; patient states has been there years without change in size.   Yesterday stung by hornet x 4 (arm, hand, back) but minimal residual erythema currently.  Neurological:     Mental Status: He is alert and oriented to person, place, and time.  Psychiatric:        Behavior: Behavior normal.     Assessment/Plan  1. Atherosclerosis of native coronary artery of native heart without angina pectoris Cholesterol has been well controlled. bp well controlled. Continue current medication.   2. Essential  hypertension, benign Well controlled. Continue current medication. - CBC with Differential/Platelet; Future - Comprehensive metabolic panel; Future - labetalol (NORMODYNE) 200 MG tablet; Take 1 tablet (200 mg total) by mouth 3 (three) times daily.  Dispense: 270 tablet; Refill: 1 - lisinopril (ZESTRIL) 40 MG tablet; Take 1 tablet (40 mg total) by mouth daily.  Dispense: 90 tablet; Refill: 1  3. Pure hypercholesterolemia Continue with lipitor. - Lipid panel; Future - atorvastatin (LIPITOR) 80 MG tablet; Take 1 tablet (80 mg total) by mouth daily.  Dispense: 90 tablet; Refill: 1 - niacin (NIASPAN) 500 MG CR tablet; Take 1 tablet (500 mg total) by mouth at bedtime.  Dispense: 90 tablet; Refill: 1  4. Hyperglycemia - Hemoglobin A1c; Future  5. CORONARY ATHEROSCLEROSIS NATIVE CORONARY ARTERY - nitroGLYCERIN (NITROSTAT) 0.4 MG SL tablet; Place 1 tablet (0.4 mg total) under the tongue every 5 (five) minutes as needed for chest pain.  Dispense: 25 tablet; Refill: 1    Return in about 6 months (around 03/20/2020) for physical exam.     Micheline Rough, MD

## 2019-09-19 LAB — CBC WITH DIFFERENTIAL/PLATELET
Absolute Monocytes: 795 cells/uL (ref 200–950)
Basophils Absolute: 41 cells/uL (ref 0–200)
Basophils Relative: 0.5 %
Eosinophils Absolute: 287 cells/uL (ref 15–500)
Eosinophils Relative: 3.5 %
HCT: 46.6 % (ref 38.5–50.0)
Hemoglobin: 15.6 g/dL (ref 13.2–17.1)
Lymphs Abs: 1410 cells/uL (ref 850–3900)
MCH: 28.8 pg (ref 27.0–33.0)
MCHC: 33.5 g/dL (ref 32.0–36.0)
MCV: 86.1 fL (ref 80.0–100.0)
MPV: 9.8 fL (ref 7.5–12.5)
Monocytes Relative: 9.7 %
Neutro Abs: 5666 cells/uL (ref 1500–7800)
Neutrophils Relative %: 69.1 %
Platelets: 213 10*3/uL (ref 140–400)
RBC: 5.41 10*6/uL (ref 4.20–5.80)
RDW: 12.1 % (ref 11.0–15.0)
Total Lymphocyte: 17.2 %
WBC: 8.2 10*3/uL (ref 3.8–10.8)

## 2019-09-19 LAB — COMPREHENSIVE METABOLIC PANEL
AG Ratio: 1.8 (calc) (ref 1.0–2.5)
ALT: 30 U/L (ref 9–46)
AST: 27 U/L (ref 10–35)
Albumin: 4.5 g/dL (ref 3.6–5.1)
Alkaline phosphatase (APISO): 71 U/L (ref 35–144)
BUN: 21 mg/dL (ref 7–25)
CO2: 24 mmol/L (ref 20–32)
Calcium: 9.6 mg/dL (ref 8.6–10.3)
Chloride: 105 mmol/L (ref 98–110)
Creat: 1.06 mg/dL (ref 0.70–1.25)
Globulin: 2.5 g/dL (calc) (ref 1.9–3.7)
Glucose, Bld: 105 mg/dL — ABNORMAL HIGH (ref 65–99)
Potassium: 4.4 mmol/L (ref 3.5–5.3)
Sodium: 139 mmol/L (ref 135–146)
Total Bilirubin: 0.8 mg/dL (ref 0.2–1.2)
Total Protein: 7 g/dL (ref 6.1–8.1)

## 2019-09-19 LAB — LIPID PANEL
Cholesterol: 127 mg/dL (ref <200)
HDL: 42 mg/dL (ref >=40)
LDL Cholesterol (Calc): 62 mg/dL (calc)
Non-HDL Cholesterol (Calc): 85 mg/dL (calc) (ref <130)
Total CHOL/HDL Ratio: 3 (calc) (ref <5.0)
Triglycerides: 144 mg/dL (ref <150)

## 2020-03-19 ENCOUNTER — Other Ambulatory Visit: Payer: Self-pay | Admitting: Family Medicine

## 2020-03-19 DIAGNOSIS — I10 Essential (primary) hypertension: Secondary | ICD-10-CM

## 2020-03-19 DIAGNOSIS — E7800 Pure hypercholesterolemia, unspecified: Secondary | ICD-10-CM

## 2020-03-25 ENCOUNTER — Encounter: Payer: Self-pay | Admitting: Family Medicine

## 2020-03-25 ENCOUNTER — Ambulatory Visit (INDEPENDENT_AMBULATORY_CARE_PROVIDER_SITE_OTHER): Payer: PRIVATE HEALTH INSURANCE | Admitting: Family Medicine

## 2020-03-25 ENCOUNTER — Other Ambulatory Visit: Payer: Self-pay

## 2020-03-25 VITALS — BP 128/62 | HR 74 | Temp 97.5°F | Ht 66.54 in | Wt 165.7 lb

## 2020-03-25 DIAGNOSIS — E7800 Pure hypercholesterolemia, unspecified: Secondary | ICD-10-CM

## 2020-03-25 DIAGNOSIS — I10 Essential (primary) hypertension: Secondary | ICD-10-CM

## 2020-03-25 DIAGNOSIS — R739 Hyperglycemia, unspecified: Secondary | ICD-10-CM | POA: Diagnosis not present

## 2020-03-25 DIAGNOSIS — Z0000 Encounter for general adult medical examination without abnormal findings: Secondary | ICD-10-CM

## 2020-03-25 DIAGNOSIS — I2510 Atherosclerotic heart disease of native coronary artery without angina pectoris: Secondary | ICD-10-CM

## 2020-03-25 DIAGNOSIS — L989 Disorder of the skin and subcutaneous tissue, unspecified: Secondary | ICD-10-CM

## 2020-03-25 LAB — CBC WITH DIFFERENTIAL/PLATELET
Basophils Absolute: 0 10*3/uL (ref 0.0–0.1)
Basophils Relative: 0.3 % (ref 0.0–3.0)
Eosinophils Absolute: 0.2 10*3/uL (ref 0.0–0.7)
Eosinophils Relative: 2.8 % (ref 0.0–5.0)
HCT: 46 % (ref 39.0–52.0)
Hemoglobin: 15.4 g/dL (ref 13.0–17.0)
Lymphocytes Relative: 17.3 % (ref 12.0–46.0)
Lymphs Abs: 1.3 10*3/uL (ref 0.7–4.0)
MCHC: 33.5 g/dL (ref 30.0–36.0)
MCV: 84.7 fl (ref 78.0–100.0)
Monocytes Absolute: 0.7 10*3/uL (ref 0.1–1.0)
Monocytes Relative: 9.6 % (ref 3.0–12.0)
Neutro Abs: 5.1 10*3/uL (ref 1.4–7.7)
Neutrophils Relative %: 70 % (ref 43.0–77.0)
Platelets: 215 10*3/uL (ref 150.0–400.0)
RBC: 5.43 Mil/uL (ref 4.22–5.81)
RDW: 12.9 % (ref 11.5–15.5)
WBC: 7.3 10*3/uL (ref 4.0–10.5)

## 2020-03-25 LAB — LIPID PANEL
Cholesterol: 142 mg/dL (ref 0–200)
HDL: 46.6 mg/dL (ref >39.00)
LDL Cholesterol: 76 mg/dL (ref 0–99)
NonHDL: 95.78
Total CHOL/HDL Ratio: 3
Triglycerides: 101 mg/dL (ref 0.0–149.0)
VLDL: 20.2 mg/dL (ref 0.0–40.0)

## 2020-03-25 LAB — COMPREHENSIVE METABOLIC PANEL
ALT: 35 U/L (ref 0–53)
AST: 27 U/L (ref 0–37)
Albumin: 4.8 g/dL (ref 3.5–5.2)
Alkaline Phosphatase: 66 U/L (ref 39–117)
BUN: 21 mg/dL (ref 6–23)
CO2: 29 mEq/L (ref 19–32)
Calcium: 10.2 mg/dL (ref 8.4–10.5)
Chloride: 104 mEq/L (ref 96–112)
Creatinine, Ser: 1.1 mg/dL (ref 0.40–1.50)
GFR: 68.34 mL/min (ref >60.00)
Glucose, Bld: 118 mg/dL — ABNORMAL HIGH (ref 70–99)
Potassium: 4.9 mEq/L (ref 3.5–5.1)
Sodium: 141 mEq/L (ref 135–145)
Total Bilirubin: 0.7 mg/dL (ref 0.2–1.2)
Total Protein: 7.7 g/dL (ref 6.0–8.3)

## 2020-03-25 LAB — HEMOGLOBIN A1C: Hgb A1c MFr Bld: 5.6 % (ref 4.6–6.5)

## 2020-03-25 MED ORDER — LABETALOL HCL 200 MG PO TABS
200.0000 mg | ORAL_TABLET | Freq: Three times a day (TID) | ORAL | 1 refills | Status: DC
Start: 2020-03-25 — End: 2021-03-31

## 2020-03-25 MED ORDER — NITROGLYCERIN 0.4 MG SL SUBL: 0.4000 mg | SUBLINGUAL_TABLET | SUBLINGUAL | 1 refills | Status: DC | PRN

## 2020-03-25 MED ORDER — LISINOPRIL 40 MG PO TABS: 40.0000 mg | ORAL_TABLET | Freq: Every day | ORAL | 1 refills | Status: DC

## 2020-03-25 MED ORDER — ATORVASTATIN CALCIUM 80 MG PO TABS: 80.0000 mg | ORAL_TABLET | Freq: Every day | ORAL | 1 refills | Status: DC

## 2020-03-25 NOTE — Addendum Note (Signed)
Addended by: Marrion Coy on: 03/25/2020 09:52 AM   Modules accepted: Orders

## 2020-03-25 NOTE — Patient Instructions (Signed)
Get your second shingles vaccine and have pharmacy fax [place] confirmation.

## 2020-03-25 NOTE — Progress Notes (Signed)
[name] [name] DOB: 05-01-1950 Encounter date: 03/25/2020  This is a 70 y.o. male who presents for complete physical   History of present illness/Additional concerns: Last visit was 09/2019  Hyperlipidemia: On Lipitor, niacin Hypertension: Labetalol 200 mg 3 times daily, lisinopril 40 mg daily. Does check at home; states all numbers are good.  CAD: History of inferior lateral wall MI in 06/1998 with normal Myoview in 2010. No chest pain.   Due for repeat colonoscopy 10/2020 due to precancerous polyp.   Bikes regularly. Also uses his balance machine.   Past Medical History:  Diagnosis Date  . ABNORMAL EKG 12/20/2008   Qualifier: Diagnosis of  By: Haroldine Laws, MD, Eileen Stanford Arthritis    left wrist  . Colon polyp 10/2017  . Coronary artery disease   . Hx of coronary artery disease   . Hyperlipidemia   . Hypertension   . Lower GI bleed 11/05/2017  . Myocardial infarction (lateral wall) [facility]) May 2000   inferior lateral wall myocardial infarction   Past Surgical History:  Procedure Laterality Date  . ANGIOPLASTY    . CARDIAC CATHETERIZATION      LAD 60-70% mid lesion, ramus 30% ostial lesion, circumflex was dominant with an occluded obtuse marginal branch which was angioplastied.  EF was 55%.   . COLONOSCOPY W/ POLYPECTOMY  11/05/2017  . 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 aspirin 81 MG chewable tablet Chew 1 tablet (81 mg total) by mouth daily. Hold Aspirin for 5 days, then resume  . Multiple Vitamin (MULTI-VITAMINS) TABS Take 1 tablet by mouth daily.   . niacin (NIASPAN) 500 MG CR tablet Take 1 tablet (500 mg total) by mouth at bedtime.  . [DISCONTINUED] atorvastatin (LIPITOR) 80 MG tablet TAKE 1 TABLET BY MOUTH EVERY DAY  . [DISCONTINUED] labetalol (NORMODYNE) 200 MG tablet Take 1 tablet (200 mg total) by mouth 3 (three) times daily.  . [DISCONTINUED] lisinopril (ZESTRIL) 40 MG tablet  TAKE 1 TABLET BY MOUTH EVERY DAY  . [DISCONTINUED] nitroGLYCERIN (NITROSTAT) 0.4 MG SL tablet Place 1 tablet (0.4 mg total) under the tongue every 5 (five) minutes as needed for chest pain.   Current Facility-Administered Medications for the 03/25/20 encounter (Office Visit) with Caren Macadam, MD  Medication  . 0.9 %  sodium chloride infusion   Social History   Tobacco Use  . Smoking status: Former Smoker    Types: Cigarettes    Quit date: 05/05/1991    Years since quitting: 28.9  . Smokeless tobacco: Never Used  Substance Use Topics  . Alcohol use: Not Currently   Family History  Problem Relation Age of Onset  . Heart disease Mother   . Hyperlipidemia Mother   . Hypertension Mother   . Diabetes Mother   . Diabetes Brother   . Colon cancer Neg Hx   . Colon polyps Neg Hx   . Esophageal cancer Neg Hx   . Rectal cancer Neg Hx   . Stomach cancer Neg Hx      Review of Systems  Constitutional: Negative for activity change, appetite change, chills, fatigue, fever and unexpected weight change.  HENT: Negative for congestion, ear pain, hearing loss, sinus pressure, sinus pain, sore throat and trouble swallowing.   Eyes: Negative for pain and visual disturbance.  Respiratory: Negative for cough, chest tightness, shortness of breath and wheezing.   Cardiovascular: Negative for chest pain, palpitations and  leg swelling.  Gastrointestinal: Negative for abdominal distention, abdominal pain, blood in stool, constipation, diarrhea, nausea and vomiting.  Genitourinary: Negative for decreased urine volume, difficulty urinating, dysuria, penile pain and testicular pain.  Musculoskeletal: Positive for arthralgias (right knee). Negative for back pain and joint swelling.  Skin: Negative for rash.  Neurological: Negative for dizziness, weakness, numbness and headaches.  Hematological: Negative for adenopathy. Does not bruise/bleed easily.  Psychiatric/Behavioral: Negative for agitation,  sleep disturbance and suicidal ideas. The patient is not nervous/anxious.     CBC:  Lab Results  Component Value Date   WBC 8.2 09/18/2019   HGB 15.6 09/18/2019   HGB 15.4 10/20/2016   HCT 46.6 09/18/2019   HCT 46.1 10/20/2016   MCH 28.8 09/18/2019   MCHC 33.5 09/18/2019   RDW 12.1 09/18/2019   RDW 13.5 10/20/2016   PLT 213 09/18/2019   PLT 215 10/20/2016   MPV 9.8 09/18/2019   CMP: Lab Results  Component Value Date   NA 139 09/18/2019   NA 140 10/20/2016   K 4.4 09/18/2019   CL 105 09/18/2019   CO2 24 09/18/2019   ANIONGAP 23 (H) 11/06/2017   GLUCOSE 105 (H) 09/18/2019   BUN 21 09/18/2019   BUN 23 10/20/2016   CREATININE 1.06 09/18/2019   LABGLOB 2.6 10/20/2016   GFRAA >60 11/06/2017   CALCIUM 9.6 09/18/2019   PROT 7.0 09/18/2019   PROT 7.4 10/20/2016   AGRATIO 1.8 10/20/2016   BILITOT 0.8 09/18/2019   BILITOT 0.8 10/20/2016   ALKPHOS 74 03/15/2019   ALT 30 09/18/2019   AST 27 09/18/2019   LIPID: Lab Results  Component Value Date   CHOL 127 09/18/2019   CHOL 133 10/20/2016   TRIG 144 09/18/2019   HDL 42 09/18/2019   HDL 45 10/20/2016   LDLCALC 62 09/18/2019   LABVLDL 19 10/20/2016    Objective:  BP 128/62 (BP Location: Left Arm, Patient Position: Sitting, Cuff Size: Normal)   Pulse 74   Temp (!) 97.5 F (36.4 C) (Oral)   Ht 5' 6.54" (1.69 m)   Wt 165 lb 11.2 oz (75.2 kg)   SpO2 97%   BMI 26.32 kg/m   Weight: 165 lb 11.2 oz (75.2 kg)   BP Readings from Last 3 Encounters:  03/25/20 128/62  09/18/19 110/70  03/15/19 100/70   Wt Readings from Last 3 Encounters:  03/25/20 165 lb 11.2 oz (75.2 kg)  09/18/19 164 lb (74.4 kg)  03/15/19 166 lb 9.6 oz (75.6 kg)    Physical Exam Constitutional:      General: He is not in acute distress.    Appearance: He is well-developed and well-nourished.  HENT:     Head: Normocephalic and atraumatic.     Right Ear: External ear normal.     Left Ear: External ear normal.     Nose: Nose normal.      Mouth/Throat:     Mouth: Oropharynx is clear and moist.     Pharynx: No oropharyngeal exudate.  Eyes:     Conjunctiva/sclera: Conjunctivae normal.     Pupils: Pupils are equal, round, and reactive to light.  Neck:     Thyroid: No thyromegaly.  Cardiovascular:     Rate and Rhythm: Normal rate and regular rhythm.     Pulses: Intact distal pulses.     Heart sounds: Normal heart sounds. No murmur heard. No friction rub. No gallop.   Pulmonary:     Effort: Pulmonary effort is normal. No respiratory distress.  Breath sounds: Normal breath sounds. No stridor. No wheezing or rales.  Abdominal:     General: Bowel sounds are normal.     Palpations: Abdomen is soft.  Musculoskeletal:        General: Normal range of motion.     Cervical back: Neck supple.  Skin:    General: Skin is warm and dry.     Comments: Pink papular lesion right cheek about 18mm in size; increase from last year  Neurological:     Mental Status: He is alert and oriented to person, place, and time.  Psychiatric:        Mood and Affect: Mood and affect normal.        Behavior: Behavior normal.        Thought Content: Thought content normal.        Judgment: Judgment normal.     Assessment/Plan: There are no preventive care reminders to display for this patient. Health Maintenance reviewed - he will get second shingrix at pharmacy.  1. Preventative health care Keep up with regular exercise. He is up to date with immunizations.  2. Essential hypertension, benign Well controlled. Continue current medications. - CBC with Differential/Platelet; Future - Comprehensive metabolic panel; Future - labetalol (NORMODYNE) 200 MG tablet; Take 1 tablet (200 mg total) by mouth 3 (three) times daily.  Dispense: 270 tablet; Refill: 1 - lisinopril (ZESTRIL) 40 MG tablet; Take 1 tablet (40 mg total) by mouth daily.  Dispense: 90 tablet; Refill: 1  3. Pure hypercholesterolemia Continue lipitor. hsa been well controlled. -  Lipid panel; Future - atorvastatin (LIPITOR) 80 MG tablet; Take 1 tablet (80 mg total) by mouth daily.  Dispense: 90 tablet; Refill: 1  4. Hyperglycemia Will recheck A1C today.  5. Atherosclerosis of native coronary artery of native heart without angina pectoris Cholesterol/bp has been well controlled.   6. CORONARY ATHEROSCLEROSIS NATIVE CORONARY ARTERY - nitroGLYCERIN (NITROSTAT) 0.4 MG SL tablet; Place 1 tablet (0.4 mg total) under the tongue every 5 (five) minutes as needed for chest pain.  Dispense: 25 tablet; Refill: 1  7. Skin lesion of face Suggest return for biopsy or can be referred to derm if prefers. Discussed concern that lesion could be skin cancer.  Return in about 6 months (around 09/22/2020) for CCV due in 6 months; can do skin biopsy on cheek sooner if desired.  Micheline Rough, MD

## 2020-09-23 ENCOUNTER — Encounter: Payer: Self-pay | Admitting: Family Medicine

## 2020-09-23 ENCOUNTER — Ambulatory Visit (INDEPENDENT_AMBULATORY_CARE_PROVIDER_SITE_OTHER): Payer: No Typology Code available for payment source | Admitting: Family Medicine

## 2020-09-23 ENCOUNTER — Other Ambulatory Visit: Payer: Self-pay

## 2020-09-23 VITALS — BP 132/70 | HR 60 | Temp 97.6°F | Ht 65.0 in | Wt 165.8 lb

## 2020-09-23 DIAGNOSIS — L989 Disorder of the skin and subcutaneous tissue, unspecified: Secondary | ICD-10-CM

## 2020-09-23 DIAGNOSIS — E7800 Pure hypercholesterolemia, unspecified: Secondary | ICD-10-CM

## 2020-09-23 DIAGNOSIS — L720 Epidermal cyst: Secondary | ICD-10-CM

## 2020-09-23 DIAGNOSIS — L739 Follicular disorder, unspecified: Secondary | ICD-10-CM | POA: Diagnosis not present

## 2020-09-23 DIAGNOSIS — R7301 Impaired fasting glucose: Secondary | ICD-10-CM

## 2020-09-23 DIAGNOSIS — I10 Essential (primary) hypertension: Secondary | ICD-10-CM | POA: Diagnosis not present

## 2020-09-23 DIAGNOSIS — D2239 Melanocytic nevi of other parts of face: Secondary | ICD-10-CM | POA: Diagnosis not present

## 2020-09-23 LAB — COMPREHENSIVE METABOLIC PANEL
ALT: 32 U/L (ref 0–53)
AST: 26 U/L (ref 0–37)
Albumin: 4.7 g/dL (ref 3.5–5.2)
Alkaline Phosphatase: 75 U/L (ref 39–117)
BUN: 18 mg/dL (ref 6–23)
CO2: 29 mEq/L (ref 19–32)
Calcium: 9.9 mg/dL (ref 8.4–10.5)
Chloride: 103 mEq/L (ref 96–112)
Creatinine, Ser: 1.1 mg/dL (ref 0.40–1.50)
GFR: 68.1 mL/min (ref >60.00)
Glucose, Bld: 99 mg/dL (ref 70–99)
Potassium: 4.8 mEq/L (ref 3.5–5.1)
Sodium: 139 mEq/L (ref 135–145)
Total Bilirubin: 1 mg/dL (ref 0.2–1.2)
Total Protein: 7.3 g/dL (ref 6.0–8.3)

## 2020-09-23 LAB — CBC WITH DIFFERENTIAL/PLATELET
Basophils Absolute: 0 10*3/uL (ref 0.0–0.1)
Basophils Relative: 0.5 % (ref 0.0–3.0)
Eosinophils Absolute: 0.3 10*3/uL (ref 0.0–0.7)
Eosinophils Relative: 4.4 % (ref 0.0–5.0)
HCT: 45.7 % (ref 39.0–52.0)
Hemoglobin: 15.3 g/dL (ref 13.0–17.0)
Lymphocytes Relative: 17.5 % (ref 12.0–46.0)
Lymphs Abs: 1.2 10*3/uL (ref 0.7–4.0)
MCHC: 33.6 g/dL (ref 30.0–36.0)
MCV: 85 fl (ref 78.0–100.0)
Monocytes Absolute: 0.7 10*3/uL (ref 0.1–1.0)
Monocytes Relative: 9.6 % (ref 3.0–12.0)
Neutro Abs: 4.8 10*3/uL (ref 1.4–7.7)
Neutrophils Relative %: 68 % (ref 43.0–77.0)
Platelets: 209 10*3/uL (ref 150.0–400.0)
RBC: 5.37 Mil/uL (ref 4.22–5.81)
RDW: 13.4 % (ref 11.5–15.5)
WBC: 7.1 10*3/uL (ref 4.0–10.5)

## 2020-09-23 LAB — LIPID PANEL
Cholesterol: 147 mg/dL (ref 0–200)
HDL: 40.5 mg/dL (ref >39.00)
LDL Cholesterol: 70 mg/dL (ref 0–99)
NonHDL: 106.36
Total CHOL/HDL Ratio: 4
Triglycerides: 182 mg/dL — ABNORMAL HIGH (ref 0.0–149.0)
VLDL: 36.4 mg/dL (ref 0.0–40.0)

## 2020-09-23 NOTE — Progress Notes (Addendum)
[name] [name] DOB: 05-22-1950 Encounter date: 09/23/2020  This is a 70 y.o. male who presents with Chief Complaint  Patient presents with   Follow-up    History of present illness:  No worries today.   Still exercising regularly.   HTN: labetalol '200mg'$  TID, lisinopril '40mg'$  daily; checks at home on occasion. Similar to here.   HL: lipitor and niacin otc.   Right knee bothers him some; but not ready for replacement now.   No Known Allergies No outpatient medications have been marked as taking for the 09/23/20 encounter (Office Visit) with Caren Macadam, MD.   Current Facility-Administered Medications for the 09/23/20 encounter (Office Visit) with Caren Macadam, MD  Medication   0.9 %  sodium chloride infusion    Review of Systems  Constitutional:  Negative for chills, fatigue and fever.  Respiratory:  Negative for cough, chest tightness, shortness of breath and wheezing.   Cardiovascular:  Negative for chest pain, palpitations and leg swelling.   Objective:  BP 132/70 (BP Location: Left Arm, Patient Position: Sitting, Cuff Size: Normal)   Pulse 60   Temp 97.6 F (36.4 C) (Oral)   Ht '5\' 5"'$  (1.651 m)   Wt 165 lb 12.8 oz (75.2 kg)   BMI 27.59 kg/m   Weight: 165 lb 12.8 oz (75.2 kg)   BP Readings from Last 3 Encounters:  09/23/20 132/70  03/25/20 128/62  09/18/19 110/70   Wt Readings from Last 3 Encounters:  09/23/20 165 lb 12.8 oz (75.2 kg)  03/25/20 165 lb 11.2 oz (75.2 kg)  09/18/19 164 lb (74.4 kg)    Physical Exam Constitutional:      General: He is not in acute distress.    Appearance: He is well-developed.  Cardiovascular:     Rate and Rhythm: Normal rate and regular rhythm.     Heart sounds: Normal heart sounds. No murmur heard.   No friction rub.  Pulmonary:     Effort: Pulmonary effort is normal. No respiratory distress.     Breath sounds: Normal breath sounds. No wheezing or rales.  Musculoskeletal:     Right lower leg: No edema.      Left lower leg: No edema.  Skin:    Comments: Right cheek irregular pink papule 36m in diameter; enlarged from previous Right side nose 2.570mpink papule  Neurological:     Mental Status: He is alert and oriented to person, place, and time.  Psychiatric:        Behavior: Behavior normal.   Shave Biopsy Procedure Note  Pre-operative Diagnosis: Suspicious lesion  Post-operative Diagnosis: same  Locations: right cheek 61m61mnd right nose 2.61mm29mesthesia: Lidocaine 1% with epinephrine   Procedure Details  Patient informed of the risks, including bleeding and infection, and benefits of the  procedure and Verbal informed consent obtained. The lesions and surrounding areas were given a sterile prep using chloraprep and draped in the usual sterile fashion. The skin lesions were shaved superficially using a dermablade.  Hemostasis of biopsy sites was achieved using aluminum chloride. Sterile dressing applied. The specimens were sent for pathologic examination. The patient tolerated the procedure well.  Complications: none.  Plan: 1. Instructed to keep the wounds dry and covered for 24 hrs and clean thereafter with soap and water.  But no chlorine hot tubs or pool exposure to surgical sites.  2. Warning signs of infection were reviewed.   3. Recommended that the patient use OTC analgesics as needed for pain.  4. Will  contact patient with pathology results when obtained.    Assessment/Plan  1. Essential hypertension, benign Stable; continue with current medication - CBC with Differential/Platelet; Future  2. Pure hypercholesterolemia Has been stable; recheck labs today. Continue with current medications.  - Comprehensive metabolic panel; Future - Lipid panel; Future  3. Skin lesion of cheek - PR EXC SKIN BENIG <0.5 CM FACE,FACIAL  4. Lesion of skin of nose Followup pending pathology - PR EXC SKIN BENIG <0.5 CM FACE,FACIAL  5. Impaired fasting glucose Recheck bloodwork  today   Return in about 6 months (around 03/26/2021) for physical exam.    Micheline Rough, MD

## 2020-09-23 NOTE — Addendum Note (Signed)
Addended by: Amanda Cockayne on: 09/23/2020 11:28 AM   Modules accepted: Orders

## 2020-09-23 NOTE — Addendum Note (Signed)
Addended by: Agnes Lawrence on: 09/23/2020 11:22 AM   Modules accepted: Orders

## 2020-09-23 NOTE — Patient Instructions (Signed)
*  call [facility] to set up follow up colonoscopy

## 2020-12-16 ENCOUNTER — Other Ambulatory Visit: Payer: Self-pay | Admitting: Family Medicine

## 2020-12-16 DIAGNOSIS — E7800 Pure hypercholesterolemia, unspecified: Secondary | ICD-10-CM

## 2020-12-16 DIAGNOSIS — I10 Essential (primary) hypertension: Secondary | ICD-10-CM

## 2021-01-07 ENCOUNTER — Telehealth: Payer: Self-pay

## 2021-01-07 NOTE — Telephone Encounter (Signed)
I have called and left a voicemail to call back and schedule a visit with pre-visit for a recall colonoscopy.

## 2021-02-17 ENCOUNTER — Encounter: Payer: Self-pay | Admitting: Gastroenterology

## 2021-02-27 ENCOUNTER — Encounter: Payer: Self-pay | Admitting: Gastroenterology

## 2021-03-31 ENCOUNTER — Ambulatory Visit (INDEPENDENT_AMBULATORY_CARE_PROVIDER_SITE_OTHER): Payer: No Typology Code available for payment source | Admitting: Family Medicine

## 2021-03-31 ENCOUNTER — Ambulatory Visit (AMBULATORY_SURGERY_CENTER): Payer: No Typology Code available for payment source | Admitting: *Deleted

## 2021-03-31 ENCOUNTER — Encounter: Payer: Self-pay | Admitting: Family Medicine

## 2021-03-31 ENCOUNTER — Other Ambulatory Visit: Payer: Self-pay | Admitting: Family Medicine

## 2021-03-31 VITALS — Ht 67.0 in | Wt 167.0 lb

## 2021-03-31 VITALS — BP 124/70 | HR 67 | Temp 97.8°F | Ht 65.0 in | Wt 167.2 lb

## 2021-03-31 DIAGNOSIS — E7800 Pure hypercholesterolemia, unspecified: Secondary | ICD-10-CM | POA: Diagnosis not present

## 2021-03-31 DIAGNOSIS — Z0000 Encounter for general adult medical examination without abnormal findings: Secondary | ICD-10-CM

## 2021-03-31 DIAGNOSIS — I2510 Atherosclerotic heart disease of native coronary artery without angina pectoris: Secondary | ICD-10-CM

## 2021-03-31 DIAGNOSIS — R252 Cramp and spasm: Secondary | ICD-10-CM

## 2021-03-31 DIAGNOSIS — I10 Essential (primary) hypertension: Secondary | ICD-10-CM | POA: Diagnosis not present

## 2021-03-31 DIAGNOSIS — R739 Hyperglycemia, unspecified: Secondary | ICD-10-CM | POA: Diagnosis not present

## 2021-03-31 DIAGNOSIS — Z86010 Personal history of colonic polyps: Secondary | ICD-10-CM

## 2021-03-31 DIAGNOSIS — M2550 Pain in unspecified joint: Secondary | ICD-10-CM

## 2021-03-31 DIAGNOSIS — Z23 Encounter for immunization: Secondary | ICD-10-CM | POA: Diagnosis not present

## 2021-03-31 DIAGNOSIS — H6123 Impacted cerumen, bilateral: Secondary | ICD-10-CM

## 2021-03-31 DIAGNOSIS — M2540 Effusion, unspecified joint: Secondary | ICD-10-CM

## 2021-03-31 LAB — CBC WITH DIFFERENTIAL/PLATELET
Basophils Absolute: 0 10*3/uL (ref 0.0–0.1)
Basophils Relative: 0.3 % (ref 0.0–3.0)
Eosinophils Absolute: 0.3 10*3/uL (ref 0.0–0.7)
Eosinophils Relative: 2.9 % (ref 0.0–5.0)
HCT: 47.8 % (ref 39.0–52.0)
Hemoglobin: 16 g/dL (ref 13.0–17.0)
Lymphocytes Relative: 14.8 % (ref 12.0–46.0)
Lymphs Abs: 1.3 10*3/uL (ref 0.7–4.0)
MCHC: 33.4 g/dL (ref 30.0–36.0)
MCV: 84 fl (ref 78.0–100.0)
Monocytes Absolute: 0.7 10*3/uL (ref 0.1–1.0)
Monocytes Relative: 8.1 % (ref 3.0–12.0)
Neutro Abs: 6.6 10*3/uL (ref 1.4–7.7)
Neutrophils Relative %: 73.9 % (ref 43.0–77.0)
Platelets: 220 10*3/uL (ref 150.0–400.0)
RBC: 5.69 Mil/uL (ref 4.22–5.81)
RDW: 13 % (ref 11.5–15.5)
WBC: 8.9 10*3/uL (ref 4.0–10.5)

## 2021-03-31 LAB — C-REACTIVE PROTEIN: CRP: <1 mg/dL (ref 0.5–20.0)

## 2021-03-31 LAB — COMPREHENSIVE METABOLIC PANEL
ALT: 40 U/L (ref 0–53)
AST: 31 U/L (ref 0–37)
Albumin: 4.8 g/dL (ref 3.5–5.2)
Alkaline Phosphatase: 73 U/L (ref 39–117)
BUN: 16 mg/dL (ref 6–23)
CO2: 32 mEq/L (ref 19–32)
Calcium: 10.1 mg/dL (ref 8.4–10.5)
Chloride: 103 mEq/L (ref 96–112)
Creatinine, Ser: 1.16 mg/dL (ref 0.40–1.50)
GFR: 63.67 mL/min (ref >60.00)
Glucose, Bld: 125 mg/dL — ABNORMAL HIGH (ref 70–99)
Potassium: 4.9 mEq/L (ref 3.5–5.1)
Sodium: 139 mEq/L (ref 135–145)
Total Bilirubin: 0.9 mg/dL (ref 0.2–1.2)
Total Protein: 7.5 g/dL (ref 6.0–8.3)

## 2021-03-31 LAB — MAGNESIUM: Magnesium: 1.8 mg/dL (ref 1.5–2.5)

## 2021-03-31 LAB — LIPID PANEL
Cholesterol: 122 mg/dL (ref 0–200)
HDL: 44.7 mg/dL (ref >39.00)
LDL Cholesterol: 53 mg/dL (ref 0–99)
NonHDL: 77.79
Total CHOL/HDL Ratio: 3
Triglycerides: 123 mg/dL (ref 0.0–149.0)
VLDL: 24.6 mg/dL (ref 0.0–40.0)

## 2021-03-31 LAB — TSH: TSH: 1.77 u[IU]/mL (ref 0.35–5.50)

## 2021-03-31 LAB — HEMOGLOBIN A1C: Hgb A1c MFr Bld: 5.7 % (ref 4.6–6.5)

## 2021-03-31 LAB — SEDIMENTATION RATE: Sed Rate: 5 mm/hr (ref 0–20)

## 2021-03-31 MED ORDER — NA SULFATE-K SULFATE-MG SULF 17.5-3.13-1.6 GM/177ML PO SOLN: 2.0000 | Freq: Once | ORAL | 0 refills | Status: AC

## 2021-03-31 NOTE — Progress Notes (Signed)
[name] [name] DOB: 05-09-1950 Encounter date: 03/31/2021  This is a 71 y.o. male who presents for complete physical   History of present illness/Additional concerns: Last visit was 09/23/2020 for routine chronic condition visit.  Hypertension: Labetalol 200 mg 3 times daily, lisinopril 40 mg daily. Not getting high readings at home.   Hyperlipidemia: Lipitor 80 mg daily, niacin 500 mg daily  Has follow-up appointment with GI next month for repeat colonoscopy.  Getting some cramps in legs at night - hamstring, calf. Not great about water intake. Wondering what he can do to help with this.   Past Medical History:  Diagnosis Date   ABNORMAL EKG 12/20/2008   Qualifier: Diagnosis of  By: Haroldine Laws, MD, Eileen Stanford    Arthritis    left wrist   Colon polyp 10/2017   Coronary artery disease    Hx of coronary artery disease    Hyperlipidemia    Hypertension    Lower GI bleed 11/05/2017   Myocardial infarction (lateral wall) [facility]) May 2000   inferior lateral wall myocardial infarction   Past Surgical History:  Procedure Laterality Date   ANGIOPLASTY     CARDIAC CATHETERIZATION      LAD 60-70% mid lesion, ramus 30% ostial lesion, circumflex was dominant with an occluded obtuse marginal branch which was angioplastied.  EF was 55%.    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 Hold Aspirin for 5 days, then resume   atorvastatin (LIPITOR) 80 MG tablet TAKE 1 TABLET BY MOUTH EVERY DAY   labetalol (NORMODYNE) 200 MG tablet Take 1 tablet (200 mg total) by mouth 3 (three) times daily.   lisinopril (ZESTRIL) 40 MG tablet TAKE 1 TABLET BY MOUTH EVERY DAY   Multiple Vitamin (MULTI-VITAMINS) TABS Take 1 tablet by mouth daily.    niacin (NIASPAN) 500 MG CR tablet Take 1 tablet (500 mg total) by mouth at  bedtime.   nitroGLYCERIN (NITROSTAT) 0.4 MG SL tablet Place 1 tablet (0.4 mg total) under the tongue every 5 (five) minutes as needed for chest pain.   Current Facility-Administered Medications for the 03/31/21 encounter (Office Visit) with Caren Macadam, MD  Medication   0.9 %  sodium chloride infusion   Social History   Tobacco Use   Smoking status: Former    Types: Cigarettes    Quit date: 05/05/1991    Years since quitting: 29.9   Smokeless tobacco: Never  Substance Use Topics   Alcohol use: Not Currently   Family History  Problem Relation Age of Onset   Heart disease Mother    Hyperlipidemia Mother    Hypertension Mother    Diabetes Mother    Diabetes Brother    Colon cancer Neg Hx    Colon polyps Neg Hx    Esophageal cancer Neg Hx    Rectal cancer Neg Hx    Stomach cancer Neg Hx      Review of Systems  Constitutional:  Negative for activity change, appetite change, chills, fatigue, fever and unexpected weight change.  HENT:  Negative for congestion, ear pain, hearing loss, sinus pressure, sinus pain, sore throat and trouble swallowing.   Eyes:  Negative for pain and visual disturbance.  Respiratory:  Negative for cough, chest tightness, shortness of breath and wheezing.   Cardiovascular:  Negative for chest pain, palpitations and leg swelling.  Gastrointestinal:  Negative for abdominal distention, abdominal pain, blood in stool, constipation, diarrhea, nausea and vomiting.  Genitourinary:  Negative for decreased urine volume, difficulty urinating, dysuria, penile pain and testicular pain.  Musculoskeletal:  Positive for arthralgias (tolerates ok, but has pain in hands regularly). Negative for back pain and joint swelling.  Skin:  Negative for rash.  Neurological:  Negative for dizziness, weakness, numbness and headaches.  Hematological:  Negative for adenopathy. Does not bruise/bleed easily.  Psychiatric/Behavioral:  Negative for agitation, sleep disturbance and  suicidal ideas. The patient is not nervous/anxious.    CBC:  Lab Results  Component Value Date   WBC 7.1 09/23/2020   HGB 15.3 09/23/2020   HGB 15.4 10/20/2016   HCT 45.7 09/23/2020   HCT 46.1 10/20/2016   MCH 28.8 09/18/2019   MCHC 33.6 09/23/2020   RDW 13.4 09/23/2020   RDW 13.5 10/20/2016   PLT 209.0 09/23/2020   PLT 215 10/20/2016   MPV 9.8 09/18/2019   CMP: Lab Results  Component Value Date   NA 139 09/23/2020   NA 140 10/20/2016   K 4.8 09/23/2020   CL 103 09/23/2020   CO2 29 09/23/2020   ANIONGAP 23 (H) 11/06/2017   GLUCOSE 99 09/23/2020   BUN 18 09/23/2020   BUN 23 10/20/2016   CREATININE 1.10 09/23/2020   CREATININE 1.06 09/18/2019   LABGLOB 2.6 10/20/2016   GFRAA >60 11/06/2017   CALCIUM 9.9 09/23/2020   PROT 7.3 09/23/2020   PROT 7.4 10/20/2016   AGRATIO 1.8 10/20/2016   BILITOT 1.0 09/23/2020   BILITOT 0.8 10/20/2016   ALKPHOS 75 09/23/2020   ALT 32 09/23/2020   AST 26 09/23/2020   LIPID: Lab Results  Component Value Date   CHOL 147 09/23/2020   CHOL 133 10/20/2016   TRIG 182.0 (H) 09/23/2020   HDL 40.50 09/23/2020   HDL 45 10/20/2016   LDLCALC 70 09/23/2020   LDLCALC 62 09/18/2019   LABVLDL 19 10/20/2016    Objective:  BP 124/70 (BP Location: Left Arm, Patient Position: Sitting, Cuff Size: Normal)    Pulse 67    Temp 97.8 F (36.6 C) (Oral)    Ht 5\' 5"  (1.651 m)    Wt 167 lb 3.2 oz (75.8 kg)    SpO2 96%    BMI 27.82 kg/m   Weight: 167 lb 3.2 oz (75.8 kg)   BP Readings from Last 3 Encounters:  03/31/21 124/70  09/23/20 132/70  03/25/20 128/62   Wt Readings from Last 3 Encounters:  03/31/21 167 lb 3.2 oz (75.8 kg)  09/23/20 165 lb 12.8 oz (75.2 kg)  03/25/20 165 lb 11.2 oz (75.2 kg)    Physical Exam  Assessment/Plan: Health Maintenance Due  Topic Date Due   Zoster Vaccines- Shingrix (1 of 2) Never done   COLONOSCOPY (Pts 45-26yrs Insurance coverage will need to be confirmed)  11/09/2020   Health Maintenance reviewed.  1.  Preventative health care Encouraged regular exercise, keep up with healthy eating.  - Pneumococcal conjugate vaccine 20-valent (Prevnar 20)  2. Atherosclerosis of native coronary artery of native heart without angina pectoris Continue with statin, bp control.  3. Essential hypertension, benign Well controlled. Continue with current medication.  - Comprehensive metabolic panel; Future - CBC with Differential/Platelet; Future  4. Pure hypercholesterolemia Continue with statin.  - Lipid panel; Future - TSH; Future  5. Hyperglycemia Recheck A1C. Has been stable, diet controlled. - Hemoglobin A1c; Future  6.  Leg cramps Encouraged hydration. Will check for bloodwork as well that could be related.  - Magnesium; Future  7. Joint swelling Patient does not wish to pursue eval for right knee swelling; states that pain is not significant and he doesn't want any future surgeries if he can help it.  8. Arthralgia, unspecified joint - Sedimentation rate; Future - C-reactive protein; Future - ANA; Future - Rheumatoid Factor; Future  9. Bilateral impacted cerumen Unable to fully resolve in office. Will have him use wax softening drops and return in 2 weeks or so for retreatment if desired.    Return in about 6 months (around 09/28/2021) for Chronic condition visit.  Micheline Rough, MD

## 2021-03-31 NOTE — Patient Instructions (Addendum)
Shingrix at the pharmacy - just ask them about this.   Suggest using debrox ear wax drops daily for next couple of weeks; return to office for ear wax removal at that time if desired. This will help loosen up wax in the ear canal and make removal a little easier.

## 2021-03-31 NOTE — Progress Notes (Signed)
No egg or soy allergy known to patient  No issues known to pt with past sedation with any surgeries or procedures Patient denies ever being told they had issues or difficulty with intubation  No FH of Malignant Hyperthermia Pt is not on diet pills Pt is not on  home 02  Pt is not on blood thinners  Pt denies issues with constipation  No A fib or A flutter  Pt is fully vaccinated  for Covid    Due to the COVID-19 pandemic we are asking patients to follow certain guidelines in PV and the [place]   Pt aware of COVID protocols and [facility] guidelines   PV completed over the phone using interpreter line. Pt verified name, DOB, address and insurance during PV today.  Pt mailed instruction packet with copy of consent form to read and not return, and instructions.  Pt encouraged to call with questions or issues.  If pt has My chart, procedure instructions sent via My Chart

## 2021-04-01 LAB — ANA: Anti Nuclear Antibody (ANA): NEGATIVE

## 2021-04-01 LAB — RHEUMATOID FACTOR: Rheumatoid fact SerPl-aCnc: 23 IU/mL — ABNORMAL HIGH (ref <14)

## 2021-04-09 ENCOUNTER — Encounter: Payer: Self-pay | Admitting: Gastroenterology

## 2021-04-14 ENCOUNTER — Ambulatory Visit (AMBULATORY_SURGERY_CENTER): Payer: No Typology Code available for payment source | Admitting: Gastroenterology

## 2021-04-14 ENCOUNTER — Encounter: Payer: Self-pay | Admitting: Gastroenterology

## 2021-04-14 VITALS — BP 124/79 | HR 61 | Temp 97.3°F | Resp 19 | Ht 65.0 in | Wt 165.0 lb

## 2021-04-14 DIAGNOSIS — Z86010 Personal history of colonic polyps: Secondary | ICD-10-CM | POA: Diagnosis present

## 2021-04-14 DIAGNOSIS — D124 Benign neoplasm of descending colon: Secondary | ICD-10-CM

## 2021-04-14 MED ORDER — SODIUM CHLORIDE 0.9 % IV SOLN: 500.0000 mL | Freq: Once | INTRAVENOUS | Status: DC

## 2021-04-14 NOTE — Progress Notes (Signed)
Pt's states no medical or surgical changes since previsit or office visit. VS by CW. 

## 2021-04-14 NOTE — Progress Notes (Signed)
[facility] History and Physical ? ? ?Primary Care Physician:  Caren Macadam, MD ? ? ?Reason for Procedure:   History of polyps ? ?Plan:     colonoscopy ? ? ? ? ?HPI: [name] [name] is a 71 y.o. male  ? ? ?Past Medical History:  ?Diagnosis Date  ? ABNORMAL EKG 12/20/2008  ? Qualifier: Diagnosis of  By: Haroldine Laws, MD, Eileen Stanford   ? Arthritis   ? left wrist  ? Colon polyp 10/2017  ? Coronary artery disease   ? Hx of coronary artery disease   ? Hyperlipidemia   ? Hypertension   ? Lower GI bleed 11/05/2017  ? Myocardial infarction (lateral wall) [facility]) May 2000  ? inferior lateral wall myocardial infarction  ? ? ?Past Surgical History:  ?Procedure Laterality Date  ? ANGIOPLASTY    ? CARDIAC CATHETERIZATION    ?  LAD 60-70% mid lesion, ramus 30% ostial lesion, circumflex was dominant with an occluded obtuse marginal branch which was angioplastied.  EF was 55%.   ? COLONOSCOPY W/ POLYPECTOMY  11/05/2017  ? 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 [facility]-per patient  ? ? ?Prior to Admission medications   ?Medication Sig Start Date End Date Taking? Authorizing Provider  ?aspirin 81 MG chewable tablet Chew 1 tablet (81 mg total) by mouth daily. Hold Aspirin for 5 days, then resume 11/06/17  Yes Amin, Ankit Chirag, MD  ?atorvastatin (LIPITOR) 80 MG tablet TAKE 1 TABLET BY MOUTH EVERY DAY 12/16/20  Yes Koberlein, Junell C, MD  ?labetalol (NORMODYNE) 200 MG tablet TAKE 1 TABLET BY MOUTH 3 TIMES DAILY. 03/31/21  Yes Koberlein, Junell C, MD  ?lisinopril (ZESTRIL) 40 MG tablet TAKE 1 TABLET BY MOUTH EVERY DAY 12/16/20  Yes Koberlein, Junell C, MD  ?Multiple Vitamin (MULTI-VITAMINS) TABS Take 1 tablet by mouth daily.  06/07/14  Yes [provider]  ?niacin (NIASPAN) 500 MG CR tablet Take 1 tablet (500 mg total) by mouth at bedtime. 09/18/19  Yes Koberlein, Steele Berg, MD  ?nitroGLYCERIN (NITROSTAT) 0.4 MG SL tablet Place 1 tablet (0.4 mg total) under the tongue every 5 (five) minutes as needed for  chest pain. ?Patient not taking: Reported on 03/31/2021 03/25/20   Caren Macadam, MD  ? ? ?Current Outpatient Medications  ?Medication Sig Dispense Refill  ? aspirin 81 MG chewable tablet Chew 1 tablet (81 mg total) by mouth daily. Hold Aspirin for 5 days, then resume    ? atorvastatin (LIPITOR) 80 MG tablet TAKE 1 TABLET BY MOUTH EVERY DAY 90 tablet 1  ? labetalol (NORMODYNE) 200 MG tablet TAKE 1 TABLET BY MOUTH 3 TIMES DAILY. 270 tablet 1  ? lisinopril (ZESTRIL) 40 MG tablet TAKE 1 TABLET BY MOUTH EVERY DAY 90 tablet 1  ? Multiple Vitamin (MULTI-VITAMINS) TABS Take 1 tablet by mouth daily.     ? niacin (NIASPAN) 500 MG CR tablet Take 1 tablet (500 mg total) by mouth at bedtime. 90 tablet 1  ? nitroGLYCERIN (NITROSTAT) 0.4 MG SL tablet Place 1 tablet (0.4 mg total) under the tongue every 5 (five) minutes as needed for chest pain. (Patient not taking: Reported on 03/31/2021) 25 tablet 1  ? ?Current Facility-Administered Medications  ?Medication Dose Route Frequency Provider Last Rate Last Admin  ? 0.9 %  sodium chloride infusion  500 mL Intravenous Once Jackquline Denmark, MD      ? 0.9 %  sodium chloride infusion  500 mL Intravenous Once Jackquline Denmark, MD      ? ? ?  Allergies as of 04/14/2021  ? (No Known Allergies)  ? ? ?Family History  ?Problem Relation Age of Onset  ? Heart disease Mother   ? Hyperlipidemia Mother   ? Hypertension Mother   ? Diabetes Mother   ? Diabetes Brother   ? Colon cancer Neg Hx   ? Colon polyps Neg Hx   ? Esophageal cancer Neg Hx   ? Rectal cancer Neg Hx   ? Stomach cancer Neg Hx   ? ? ?Social History  ? ?Socioeconomic History  ? Marital status: Married  ?  Spouse name: Not on file  ? Number of children: Not on file  ? Years of education: Not on file  ? Highest education level: Not on file  ?Occupational History  ? Not on file  ?Tobacco Use  ? Smoking status: Former  ?  Types: Cigarettes  ?  Quit date: 05/05/1991  ?  Years since quitting: 29.9  ? Smokeless tobacco: Never  ?Vaping Use  ?  Vaping Use: Never used  ?Substance and Sexual Activity  ? Alcohol use: Not Currently  ? Drug use: No  ? Sexual activity: Not on file  ?Other Topics Concern  ? Not on file  ?Social History Narrative  ? Work or School: packaging  ?   ? Home Situation: lives with wife and son  ?   ? Spiritual Beliefs: none  ?   ? Lifestyle: CV exercise 10 minutes per day, healthy  ?   ?   ?   ? ?Social Determinants of Health  ? ?Financial Resource Strain: Not on file  ?Food Insecurity: Not on file  ?Transportation Needs: Not on file  ?Physical Activity: Not on file  ?Stress: Not on file  ?Social Connections: Not on file  ?Intimate Partner Violence: Not on file  ? ? ?Review of Systems: ?Positive for  none ?All other review of systems negative except as mentioned in the HPI. ? ?Physical Exam: ?Vital signs in last 24 hours: ?'@VSRANGES'$ @ ?  ?General:   Alert,  Well-developed, well-nourished, pleasant and cooperative in NAD ?Lungs:  Clear throughout to auscultation.   ?Heart:  Regular rate and rhythm; no murmurs, clicks, rubs,  or gallops. ?Abdomen:  Soft, nontender and nondistended. Normal bowel sounds.   ?Neuro/Psych:  Alert and cooperative. Normal mood and affect. A and O x 3 ? ? ? ?No significant changes were identified.  The patient continues to be an appropriate candidate for the planned procedure and anesthesia. ? ? ?Carmell Austria, MD. ?[facility] ?04/14/2021 10:21 AM@ ? ?

## 2021-04-14 NOTE — Progress Notes (Signed)
Patients son, [name] [name], interpreting for him ?

## 2021-04-14 NOTE — Progress Notes (Signed)
To Pacu, VSS. Report to [email or web address] 

## 2021-04-14 NOTE — Patient Instructions (Signed)
Handouts Provided:  Polyps  YOU HAD AN ENDOSCOPIC PROCEDURE TODAY AT THE [facility]:   Refer to the procedure report that was given to you for any specific questions about what was found during the examination.  If the procedure report does not answer your questions, please call your gastroenterologist to clarify.  If you requested that your care partner not be given the details of your procedure findings, then the procedure report has been included in a sealed envelope for you to review at your convenience later.  YOU SHOULD EXPECT: Some feelings of bloating in the abdomen. Passage of more gas than usual.  Walking can help get rid of the air that was put into your GI tract during the procedure and reduce the bloating. If you had a lower endoscopy (such as a colonoscopy or flexible sigmoidoscopy) you may notice spotting of blood in your stool or on the toilet paper. If you underwent a bowel prep for your procedure, you may not have a normal bowel movement for a few days.  Please Note:  You might notice some irritation and congestion in your nose or some drainage.  This is from the oxygen used during your procedure.  There is no need for concern and it should clear up in a day or so.  SYMPTOMS TO REPORT IMMEDIATELY:   Following lower endoscopy (colonoscopy or flexible sigmoidoscopy):  Excessive amounts of blood in the stool  Significant tenderness or worsening of abdominal pains  Swelling of the abdomen that is new, acute  Fever of 100F or higher  For urgent or emergent issues, a gastroenterologist can be reached at any hour by calling [phone]. Do not use MyChart messaging for urgent concerns.    DIET:  We do recommend a small meal at first, but then you may proceed to your regular diet.  Drink plenty of fluids but you should avoid alcoholic beverages for 24 hours.  ACTIVITY:  You should plan to take it easy for the rest of today and you should NOT DRIVE or use heavy  machinery until tomorrow (because of the sedation medicines used during the test).    FOLLOW UP: Our staff will call the number listed on your records 48-72 hours following your procedure to check on you and address any questions or concerns that you may have regarding the information given to you following your procedure. If we do not reach you, we will leave a message.  We will attempt to reach you two times.  During this call, we will ask if you have developed any symptoms of COVID 19. If you develop any symptoms (ie: fever, flu-like symptoms, shortness of breath, cough etc.) before then, please call [phone].  If you test positive for Covid 19 in the 2 weeks post procedure, please call and report this information to us.    If any biopsies were taken you will be contacted by phone or by letter within the next 1-3 weeks.  Please call us at [phone] if you have not heard about the biopsies in 3 weeks.    SIGNATURES/CONFIDENTIALITY: You and/or your care partner have signed paperwork which will be entered into your electronic medical record.  These signatures attest to the fact that that the information above on your After Visit Summary has been reviewed and is understood.  Full responsibility of the confidentiality of this discharge information lies with you and/or your care-partner.  

## 2021-04-14 NOTE — Progress Notes (Signed)
Called to room to assist during endoscopic procedure.  Patient ID and intended procedure confirmed with present staff. Received instructions for my participation in the procedure from the performing physician.  

## 2021-04-14 NOTE — Op Note (Signed)
[place] ?Patient Name: [name] [name] ?Procedure Date: 04/14/2021 10:26 AM ?MRN: [id number] ?Endoscopist: Jackquline Denmark , MD ?Age: 71 ?Referring MD:  ?Date of Birth: [birth year] ?Gender: Male ?Account #: [id number] ?Procedure:                Colonoscopy ?Indications:              High risk colon cancer surveillance: Personal  ?                          history of colonic polyps ?Medicines:                Monitored Anesthesia Care ?Procedure:                Pre-Anesthesia Assessment: ?                          - Prior to the procedure, a History and Physical  ?                          was performed, and patient medications and  ?                          allergies were reviewed. The patient's tolerance of  ?                          previous anesthesia was also reviewed. The risks  ?                          and benefits of the procedure and the sedation  ?                          options and risks were discussed with the patient.  ?                          All questions were answered, and informed consent  ?                          was obtained. Prior Anticoagulants: The patient has  ?                          taken no previous anticoagulant or antiplatelet  ?                          agents. ASA Grade Assessment: II - A patient with  ?                          mild systemic disease. After reviewing the risks  ?                          and benefits, the patient was deemed in  ?                          satisfactory condition to undergo the procedure. ?  After obtaining informed consent, the colonoscope  ?                          was passed under direct vision. Throughout the  ?                          procedure, the patient's blood pressure, pulse, and  ?                          oxygen saturations were monitored continuously. The  ?                          Olympus CF-HQ190L (#[id number]) Colonoscope was  ?                          introduced through the anus and advanced to  the 2  ?                          cm into the ileum. The colonoscopy was performed  ?                          without difficulty. The patient tolerated the  ?                          procedure well. The quality of the bowel  ?                          preparation was good. The terminal ileum, ileocecal  ?                          valve, appendiceal orifice, and rectum were  ?                          photographed. ?Scope In: 10:29:09 AM ?Scope Out: 10:44:59 AM ?Scope Withdrawal Time: 0 hours 12 minutes 31 seconds  ?Total Procedure Duration: 0 hours 15 minutes 50 seconds  ?Findings:                 A 6 mm polyp was found in the mid descending colon.  ?                          The polyp was sessile. The polyp was removed with a  ?                          cold snare. Resection and retrieval were complete. ?                          A few small-mouthed diverticula were found in the  ?                          sigmoid colon. ?                          Non-bleeding internal hemorrhoids were found during  ?  retroflexion. The hemorrhoids were moderate and  ?                          Grade I (internal hemorrhoids that do not prolapse). ?                          The terminal ileum appeared normal. ?                          The exam was otherwise without abnormality on  ?                          direct and retroflexion views. ?Complications:            No immediate complications. ?Estimated Blood Loss:     Estimated blood loss: none. ?Impression:               - One 6 mm polyp in the mid descending colon,  ?                          removed with a cold snare. Resected and retrieved. ?                          - Minimal sigmoid diverticulosis. ?                          - Non-bleeding internal hemorrhoids. ?                          - The examined portion of the ileum was normal. ?                          - The examination was otherwise normal on direct  ?                          and  retroflexion views. ?Recommendation:           - Patient has a contact number available for  ?                          emergencies. The signs and symptoms of potential  ?                          delayed complications were discussed with the  ?                          patient. Return to normal activities tomorrow.  ?                          Written discharge instructions were provided to the  ?                          patient. ?                          - Resume previous diet. ?                          -  Continue present medications. ?                          - Await pathology results. ?                          - Repeat colonoscopy in 5 years for surveillance  ?                          d/t previous history of significant polyps. ?                          - The findings and recommendations were discussed  ?                          with the patient's family. ?Jackquline Denmark, MD ?04/14/2021 10:49:40 AM ?This report has been signed electronically. ?

## 2021-04-16 ENCOUNTER — Telehealth: Payer: Self-pay

## 2021-04-16 NOTE — Telephone Encounter (Signed)
?  Follow up Call- ? ?Call back number 04/14/2021  ?Post procedure Call Back phone  # [phone]  ?Permission to leave phone message Yes  ?Some recent data might be hidden  ?  ? ?Patient questions: ? ?Do you have a fever, pain , or abdominal swelling? No. ?Pain Score  0 * ? ?Have you tolerated food without any problems? Yes.   ? ?Have you been able to return to your normal activities? Yes.   ? ?Do you have any questions about your discharge instructions: ?Diet   No. ?Medications  No. ?Follow up visit  No. ? ?Do you have questions or concerns about your Care? No. ? ?Actions: ?* If pain score is 4 or above: ?No action needed, pain <4. ? ? ?

## 2021-04-16 NOTE — Telephone Encounter (Signed)
?  Follow up Call- ? ?Call back number 04/14/2021  ?Post procedure Call Back phone  # [phone]  ?Permission to leave phone message Yes  ?Some recent data might be hidden  ?  ? ?Left message ? ?

## 2021-04-16 NOTE — Telephone Encounter (Signed)
Patient son returned call states patient is doing well and went back to work ?

## 2021-04-17 ENCOUNTER — Encounter: Payer: Self-pay | Admitting: Gastroenterology

## 2021-06-10 ENCOUNTER — Other Ambulatory Visit: Payer: Self-pay | Admitting: Family Medicine

## 2021-06-10 DIAGNOSIS — E7800 Pure hypercholesterolemia, unspecified: Secondary | ICD-10-CM

## 2021-06-10 DIAGNOSIS — I10 Essential (primary) hypertension: Secondary | ICD-10-CM

## 2021-09-11 ENCOUNTER — Other Ambulatory Visit: Payer: Self-pay | Admitting: *Deleted

## 2021-09-11 DIAGNOSIS — E7800 Pure hypercholesterolemia, unspecified: Secondary | ICD-10-CM

## 2021-09-11 DIAGNOSIS — I10 Essential (primary) hypertension: Secondary | ICD-10-CM

## 2021-09-12 MED ORDER — ATORVASTATIN CALCIUM 80 MG PO TABS: 80.0000 mg | ORAL_TABLET | Freq: Every day | ORAL | 0 refills | Status: DC

## 2021-09-12 MED ORDER — LISINOPRIL 40 MG PO TABS: 40.0000 mg | ORAL_TABLET | Freq: Every day | ORAL | 0 refills | Status: DC

## 2021-09-12 NOTE — Telephone Encounter (Signed)
Ok to refill, he should have a 6 month follow up appointment with me. Thanks!

## 2021-09-22 ENCOUNTER — Other Ambulatory Visit: Payer: Self-pay | Admitting: *Deleted

## 2021-09-22 DIAGNOSIS — I10 Essential (primary) hypertension: Secondary | ICD-10-CM

## 2021-09-22 MED ORDER — LABETALOL HCL 200 MG PO TABS: 200.0000 mg | ORAL_TABLET | Freq: Three times a day (TID) | ORAL | 0 refills | Status: DC

## 2021-09-24 ENCOUNTER — Other Ambulatory Visit: Payer: Self-pay | Admitting: *Deleted

## 2021-09-24 DIAGNOSIS — I2510 Atherosclerotic heart disease of native coronary artery without angina pectoris: Secondary | ICD-10-CM

## 2021-09-24 MED ORDER — NITROGLYCERIN 0.4 MG SL SUBL: 0.4000 mg | SUBLINGUAL_TABLET | SUBLINGUAL | 0 refills | Status: AC | PRN

## 2021-10-07 ENCOUNTER — Other Ambulatory Visit: Payer: Self-pay | Admitting: Family Medicine

## 2021-10-07 DIAGNOSIS — E7800 Pure hypercholesterolemia, unspecified: Secondary | ICD-10-CM

## 2021-10-24 ENCOUNTER — Other Ambulatory Visit: Payer: Self-pay | Admitting: Family Medicine

## 2021-10-24 DIAGNOSIS — I10 Essential (primary) hypertension: Secondary | ICD-10-CM

## 2021-10-27 ENCOUNTER — Other Ambulatory Visit: Payer: Self-pay | Admitting: Family Medicine

## 2021-10-27 DIAGNOSIS — I10 Essential (primary) hypertension: Secondary | ICD-10-CM

## 2021-10-27 MED ORDER — LISINOPRIL 40 MG PO TABS: 40.0000 mg | ORAL_TABLET | Freq: Every day | ORAL | 0 refills | Status: DC

## 2021-10-27 NOTE — Telephone Encounter (Signed)
Ok to refill but she needs an appointment to transition her care

## 2021-11-07 ENCOUNTER — Telehealth: Payer: Self-pay | Admitting: Registered Nurse

## 2021-11-07 DIAGNOSIS — Z0279 Encounter for issue of other medical certificate: Secondary | ICD-10-CM

## 2021-11-07 NOTE — Telephone Encounter (Signed)
Per Epic review patient had labs and annual physical with PCM but forms not turned in to [facility] clinic staff.  Alexis notified patient met requirements for labs/BP but has not signed tobacco attestation.  Form left for patient with supervisor in sealed envelope as clinic closed until 2 Oct after I leave today and patient at work 11/07/21.  Office visit date with PCM 03/31/21.     Latest Reference Range & Units 03/31/21 09:25  Sodium 135 - 145 mEq/L 139  Potassium 3.5 - 5.1 mEq/L 4.9  Chloride 96 - 112 mEq/L 103  CO2 19 - 32 mEq/L 32  Glucose 70 - 99 mg/dL 125 (H)  BUN 6 - 23 mg/dL 16  Creatinine 0.40 - 1.50 mg/dL 1.16  Calcium 8.4 - 10.5 mg/dL 10.1  Magnesium 1.5 - 2.5 mg/dL 1.8  Alkaline Phosphatase 39 - 117 U/L 73  Albumin 3.5 - 5.2 g/dL 4.8  AST 0 - 37 U/L 31  ALT 0 - 53 U/L 40  Total Protein 6.0 - 8.3 g/dL 7.5  Total Bilirubin 0.2 - 1.2 mg/dL 0.9  GFR >60.00 mL/min 63.67  Total CHOL/HDL Ratio  3  Cholesterol 0 - 200 mg/dL 122  HDL Cholesterol >39.00 mg/dL 44.70  LDL (calc) 0 - 99 mg/dL 53  NonHDL  77.79  Triglycerides 0.0 - 149.0 mg/dL 123.0  VLDL 0.0 - 40.0 mg/dL 24.6  CRP 0.5 - 20.0 mg/dL <1.0  WBC 4.0 - 10.5 K/uL 8.9  RBC 4.22 - 5.81 Mil/uL 5.69  Hemoglobin 13.0 - 17.0 g/dL 16.0  HCT 39.0 - 52.0 % 47.8  MCV 78.0 - 100.0 fl 84.0  MCHC 30.0 - 36.0 g/dL 33.4  RDW 11.5 - 15.5 % 13.0  Platelets 150.0 - 400.0 K/uL 220.0  Neutrophils 43.0 - 77.0 % 73.9  Lymphocytes 12.0 - 46.0 % 14.8  Monocytes Relative 3.0 - 12.0 % 8.1  Eosinophil 0.0 - 5.0 % 2.9  Basophil 0.0 - 3.0 % 0.3  NEUT# 1.4 - 7.7 K/uL 6.6  Lymphocyte # 0.7 - 4.0 K/uL 1.3  Monocyte # 0.1 - 1.0 K/uL 0.7  Eosinophils Absolute 0.0 - 0.7 K/uL 0.3  Basophils Absolute 0.0 - 0.1 K/uL 0.0  Sed Rate 0 - 20 mm/hr 5  Hemoglobin A1C 4.6 - 6.5 % 5.7  TSH 0.35 - 5.50 uIU/mL 1.77  Anti Nuclear Antibody (ANA) NEGATIVE  NEGATIVE  RA Latex Turbid. <14 IU/mL 23 (H)  (H): Data is abnormally high 

## 2021-11-20 ENCOUNTER — Other Ambulatory Visit: Payer: Self-pay | Admitting: Family Medicine

## 2021-11-20 DIAGNOSIS — I10 Essential (primary) hypertension: Secondary | ICD-10-CM

## 2021-12-18 ENCOUNTER — Other Ambulatory Visit: Payer: Self-pay | Admitting: Family Medicine

## 2021-12-18 DIAGNOSIS — I10 Essential (primary) hypertension: Secondary | ICD-10-CM

## 2021-12-18 NOTE — Telephone Encounter (Signed)
Rx sent for Labetalol.

## 2021-12-18 NOTE — Telephone Encounter (Signed)
-----   Message from Farrel Conners, MD sent at 12/18/2021  9:07 AM EST ----- Hey so this lady needs a visit, ok to refill her medications until she can get in to see me.

## 2021-12-19 ENCOUNTER — Other Ambulatory Visit: Payer: Self-pay | Admitting: Family Medicine

## 2021-12-19 DIAGNOSIS — I10 Essential (primary) hypertension: Secondary | ICD-10-CM

## 2021-12-19 NOTE — Telephone Encounter (Signed)
Ok to fill but she needs a follow up appt

## 2022-01-20 ENCOUNTER — Other Ambulatory Visit: Payer: Self-pay | Admitting: Family Medicine

## 2022-01-20 DIAGNOSIS — I10 Essential (primary) hypertension: Secondary | ICD-10-CM

## 2022-02-10 ENCOUNTER — Other Ambulatory Visit: Payer: Self-pay | Admitting: Family Medicine

## 2022-02-10 DIAGNOSIS — I10 Essential (primary) hypertension: Secondary | ICD-10-CM

## 2022-02-10 NOTE — Telephone Encounter (Signed)
Ok to refill but he MUST be seen by the end of February

## 2022-02-14 ENCOUNTER — Other Ambulatory Visit: Payer: Self-pay | Admitting: Family Medicine

## 2022-02-14 DIAGNOSIS — I10 Essential (primary) hypertension: Secondary | ICD-10-CM

## 2022-03-03 ENCOUNTER — Other Ambulatory Visit: Payer: Self-pay | Admitting: Family Medicine

## 2022-03-03 DIAGNOSIS — I10 Essential (primary) hypertension: Secondary | ICD-10-CM

## 2022-03-25 ENCOUNTER — Other Ambulatory Visit: Payer: Self-pay | Admitting: Family Medicine

## 2022-03-25 DIAGNOSIS — I10 Essential (primary) hypertension: Secondary | ICD-10-CM

## 2022-03-30 ENCOUNTER — Other Ambulatory Visit: Payer: Self-pay | Admitting: Family Medicine

## 2022-03-30 DIAGNOSIS — I10 Essential (primary) hypertension: Secondary | ICD-10-CM

## 2022-03-30 NOTE — Telephone Encounter (Signed)
Patient has not been in to see me, will not fill until she gets an appointment with me

## 2022-03-31 ENCOUNTER — Encounter: Payer: Self-pay | Admitting: Internal Medicine

## 2022-03-31 DIAGNOSIS — I10 Essential (primary) hypertension: Secondary | ICD-10-CM

## 2022-03-31 DIAGNOSIS — E7800 Pure hypercholesterolemia, unspecified: Secondary | ICD-10-CM

## 2022-03-31 MED ORDER — LISINOPRIL 40 MG PO TABS: 40.0000 mg | ORAL_TABLET | Freq: Every day | ORAL | 1 refills | Status: DC

## 2022-03-31 MED ORDER — LABETALOL HCL 200 MG PO TABS: ORAL_TABLET | ORAL | 1 refills | Status: DC

## 2022-03-31 MED ORDER — ATORVASTATIN CALCIUM 80 MG PO TABS: 80.0000 mg | ORAL_TABLET | Freq: Every day | ORAL | 1 refills | Status: DC

## 2022-05-05 ENCOUNTER — Encounter: Payer: Self-pay | Admitting: Registered Nurse

## 2022-05-05 ENCOUNTER — Telehealth: Payer: Self-pay | Admitting: Registered Nurse

## 2022-05-05 DIAGNOSIS — Z0000 Encounter for general adult medical examination without abnormal findings: Secondary | ICD-10-CM

## 2022-05-05 DIAGNOSIS — R7989 Other specified abnormal findings of blood chemistry: Secondary | ICD-10-CM

## 2022-05-05 NOTE — Telephone Encounter (Signed)
Epic reviewed last labs Feb 2023; male executive panel, Hgba1c and Vitamin D ordered for patient  RN Evlyn Kanner notified to schedule

## 2022-05-09 DIAGNOSIS — R7989 Other specified abnormal findings of blood chemistry: Secondary | ICD-10-CM | POA: Insufficient documentation

## 2022-05-09 DIAGNOSIS — E1100 Type 2 diabetes mellitus with hyperosmolarity without nonketotic hyperglycemic-hyperosmolar coma (NKHHC): Secondary | ICD-10-CM | POA: Insufficient documentation

## 2022-05-09 NOTE — Telephone Encounter (Signed)
See paper chart form signed by patient and returned to clinic.

## 2022-05-09 NOTE — Telephone Encounter (Signed)
Hgba1c results discussed with patient in clinic. Vitamin D at that time still pending.  My chart message sent to patient once available normal.  Be Well paperwork signed LDL and BP met requirements for insurance discount.  Patient has had 8 lb weight loss since last Be Well and BP improved to 118/70.  Hgba1c last year 10.9 patient on farxiga, metaglip, statin and has changed diet.  Exercise/activity still low per patient compared to when he was younger he will try to increase walking.  Patient to schedule follow up with PCM due May.  Patient verbalized understanding information/instructions, agreed with plan of care and had no further questions at that time.  Next Hgba1c in 3 months and vitamin D in 1 year.

## 2022-06-04 ENCOUNTER — Other Ambulatory Visit: Payer: Self-pay | Admitting: Occupational Medicine

## 2022-06-04 DIAGNOSIS — Z0000 Encounter for general adult medical examination without abnormal findings: Secondary | ICD-10-CM

## 2022-06-04 NOTE — Progress Notes (Signed)
Lab drawn tolerated well no issues noted.   

## 2022-06-05 LAB — CMP12+LP+TP+TSH+6AC+PSA+CBC…
ALT: 38 IU/L (ref 0–44)
AST: 32 IU/L (ref 0–40)
Albumin/Globulin Ratio: 1.9 (ref 1.2–2.2)
Albumin: 4.6 g/dL (ref 3.8–4.8)
Alkaline Phosphatase: 82 IU/L (ref 44–121)
BUN/Creatinine Ratio: 17 (ref 10–24)
BUN: 22 mg/dL (ref 8–27)
Basophils Absolute: 0 10*3/uL (ref 0.0–0.2)
Basos: 0 %
Bilirubin Total: 0.7 mg/dL (ref 0.0–1.2)
Calcium: 9.6 mg/dL (ref 8.6–10.2)
Chloride: 104 mmol/L (ref 96–106)
Chol/HDL Ratio: 2.3 ratio (ref 0.0–5.0)
Cholesterol, Total: 109 mg/dL (ref 100–199)
Creatinine, Ser: 1.26 mg/dL (ref 0.76–1.27)
EOS (ABSOLUTE): 0.3 10*3/uL (ref 0.0–0.4)
Eos: 4 %
Estimated CHD Risk: <0.5 times avg. (ref 0.0–1.0)
Free Thyroxine Index: 2.4 (ref 1.2–4.9)
GGT: 26 IU/L (ref 0–65)
Globulin, Total: 2.4 g/dL (ref 1.5–4.5)
Glucose: 129 mg/dL — ABNORMAL HIGH (ref 70–99)
HDL: 47 mg/dL (ref >39)
Hematocrit: 45.3 % (ref 37.5–51.0)
Hemoglobin: 14.8 g/dL (ref 13.0–17.7)
Immature Grans (Abs): 0 10*3/uL (ref 0.0–0.1)
Immature Granulocytes: 0 %
Iron: 133 ug/dL (ref 38–169)
LDH: 217 IU/L (ref 121–224)
LDL Chol Calc (NIH): 41 mg/dL (ref 0–99)
Lymphocytes Absolute: 1.4 10*3/uL (ref 0.7–3.1)
Lymphs: 22 %
MCH: 28.1 pg (ref 26.6–33.0)
MCHC: 32.7 g/dL (ref 31.5–35.7)
MCV: 86 fL (ref 79–97)
Monocytes Absolute: 0.6 10*3/uL (ref 0.1–0.9)
Monocytes: 10 %
Neutrophils Absolute: 3.9 10*3/uL (ref 1.4–7.0)
Neutrophils: 64 %
Phosphorus: 3.6 mg/dL (ref 2.8–4.1)
Platelets: 219 10*3/uL (ref 150–450)
Potassium: 4.9 mmol/L (ref 3.5–5.2)
Prostate Specific Ag, Serum: 2 ng/mL (ref 0.0–4.0)
RBC: 5.26 x10E6/uL (ref 4.14–5.80)
RDW: 13.1 % (ref 11.6–15.4)
Sodium: 141 mmol/L (ref 134–144)
T3 Uptake Ratio: 28 % (ref 24–39)
T4, Total: 8.4 ug/dL (ref 4.5–12.0)
TSH: 1.87 u[IU]/mL (ref 0.450–4.500)
Total Protein: 7 g/dL (ref 6.0–8.5)
Triglycerides: 113 mg/dL (ref 0–149)
Uric Acid: 6.9 mg/dL (ref 3.8–8.4)
VLDL Cholesterol Cal: 21 mg/dL (ref 5–40)
WBC: 6.1 10*3/uL (ref 3.4–10.8)
eGFR: 61 mL/min/{1.73_m2} (ref >59)

## 2022-06-05 LAB — HEMOGLOBIN A1C
Est. average glucose Bld gHb Est-mCnc: 114 mg/dL
Hgb A1c MFr Bld: 5.6 % (ref 4.8–5.6)

## 2022-06-05 LAB — VITAMIN D 25 HYDROXY (VIT D DEFICIENCY, FRACTURES): Vit D, 25-Hydroxy: 38.3 ng/mL (ref 30.0–100.0)

## 2022-06-07 ENCOUNTER — Encounter: Payer: Self-pay | Admitting: Registered Nurse

## 2022-06-23 ENCOUNTER — Ambulatory Visit: Payer: Self-pay | Admitting: Occupational Medicine

## 2022-06-23 VITALS — BP 140/73 | Ht 65.5 in | Wt 163.4 lb

## 2022-06-23 DIAGNOSIS — Z0000 Encounter for general adult medical examination without abnormal findings: Secondary | ICD-10-CM

## 2022-06-23 NOTE — Progress Notes (Signed)
Be well insurance premium discount evaluation: Met   Epic reviewed by RN Kimrey and transcribed. Labs  Tobacco attestation signed. [facility] ROI formed signed. Forms placed in the chart.   Patient given handouts for [facility] pharmacies and discount drugs list,MyChart, Tele doc setup, [facility], [place] counseling and [facility].  What to do for infectious illness protocol. Given handout for list of medications that can be filled at [facility]. Given Clinic hours and Clinic Email.  

## 2022-07-27 ENCOUNTER — Ambulatory Visit: Payer: No Typology Code available for payment source | Admitting: Internal Medicine

## 2022-09-26 ENCOUNTER — Other Ambulatory Visit: Payer: Self-pay | Admitting: Internal Medicine

## 2022-09-26 DIAGNOSIS — E7800 Pure hypercholesterolemia, unspecified: Secondary | ICD-10-CM

## 2022-09-26 DIAGNOSIS — I10 Essential (primary) hypertension: Secondary | ICD-10-CM

## 2022-10-06 ENCOUNTER — Ambulatory Visit: Payer: No Typology Code available for payment source | Admitting: Internal Medicine

## 2022-10-06 ENCOUNTER — Encounter: Payer: Self-pay | Admitting: Internal Medicine

## 2022-10-06 DIAGNOSIS — E1100 Type 2 diabetes mellitus with hyperosmolarity without nonketotic hyperglycemic-hyperosmolar coma (NKHHC): Secondary | ICD-10-CM

## 2022-10-06 DIAGNOSIS — I10 Essential (primary) hypertension: Secondary | ICD-10-CM | POA: Diagnosis not present

## 2022-10-06 DIAGNOSIS — I2510 Atherosclerotic heart disease of native coronary artery without angina pectoris: Secondary | ICD-10-CM | POA: Diagnosis not present

## 2022-10-06 DIAGNOSIS — E7800 Pure hypercholesterolemia, unspecified: Secondary | ICD-10-CM | POA: Diagnosis not present

## 2022-10-06 LAB — POCT GLYCOSYLATED HEMOGLOBIN (HGB A1C): Hemoglobin A1C: 5.3 % (ref 4.0–5.6)

## 2022-10-06 MED ORDER — LISINOPRIL 40 MG PO TABS: 40.0000 mg | ORAL_TABLET | Freq: Every day | ORAL | 1 refills | Status: DC

## 2022-10-06 MED ORDER — NIACIN ER (ANTIHYPERLIPIDEMIC) 500 MG PO TBCR
500.0000 mg | EXTENDED_RELEASE_TABLET | Freq: Every day | ORAL | 1 refills | Status: AC
Start: 2022-10-06 — End: ?

## 2022-10-06 MED ORDER — ATORVASTATIN CALCIUM 80 MG PO TABS: 80.0000 mg | ORAL_TABLET | Freq: Every day | ORAL | 1 refills | Status: DC

## 2022-10-06 MED ORDER — LABETALOL HCL 200 MG PO TABS
ORAL_TABLET | ORAL | 1 refills | Status: DC
Start: 2022-10-06 — End: 2023-01-06

## 2022-10-06 NOTE — Assessment & Plan Note (Signed)
Blood pressure is well-controlled on labetalol and lisinopril.

## 2022-10-06 NOTE — Assessment & Plan Note (Signed)
Refill atorvastatin 80 mg daily.  Last LDL was 41 in April 2024.

## 2022-10-06 NOTE — Progress Notes (Signed)
New Patient Office Visit     CC/Reason for Visit: Establish care, discuss chronic conditions, medication refills Previous PCP: Albina Billet Last Visit: Unknown  HPI: [name] [name] is a 72 y.o. male who is coming in today for the above mentioned reasons. Past Medical History is significant for: Hyperlipidemia and hypertension.  He is also status post left knee replacement and is working on having a right knee replacement as well.  He is recovering from a URI but is otherwise doing well.   Past Medical/Surgical History: Past Medical History:  Diagnosis Date   ABNORMAL EKG 12/20/2008   Qualifier: Diagnosis of  By: Gala Romney, MD, Trixie Dredge    Arthritis    left wrist   Colon polyp 10/2017   Coronary artery disease    Hx of coronary artery disease    Hyperlipidemia    Hypertension    Lower GI bleed 11/05/2017   Myocardial infarction (lateral wall) [facility]) May 2000   inferior lateral wall myocardial infarction    Past Surgical History:  Procedure Laterality Date   ANGIOPLASTY     CARDIAC CATHETERIZATION      LAD 60-70% mid lesion, ramus 30% ostial lesion, circumflex was dominant with an occluded obtuse marginal branch which was angioplastied.  EF was 55%.    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 His smoking use included cigarettes. He has never used smokeless tobacco. He reports that he does not currently use alcohol. He reports that he does not use drugs.  Allergies: No Known Allergies  Family History:  Family History  Problem Relation Age of Onset   Heart disease Mother    Hyperlipidemia Mother    Hypertension Mother    Diabetes Mother    Diabetes Brother    Colon cancer Neg Hx    Colon polyps Neg Hx    Esophageal cancer Neg Hx    Rectal cancer Neg Hx    Stomach cancer Neg Hx      Current Outpatient Medications:     aspirin 81 MG chewable tablet, Chew 1 tablet (81 mg total) by mouth daily. Hold Aspirin for 5 days, then resume, Disp: , Rfl:    Multiple Vitamin (MULTI-VITAMINS) TABS, Take 1 tablet by mouth daily. , Disp: , Rfl:    nitroGLYCERIN (NITROSTAT) 0.4 MG SL tablet, Place 1 tablet (0.4 mg total) under the tongue every 5 (five) minutes as needed for chest pain., Disp: 25 tablet, Rfl: 0   atorvastatin (LIPITOR) 80 MG tablet, Take 1 tablet (80 mg total) by mouth daily., Disp: 90 tablet, Rfl: 1   labetalol (NORMODYNE) 200 MG tablet, TAKE 1 TABLET BY MOUTH THREE TIMES A DAY, Disp: 270 tablet, Rfl: 1   lisinopril (ZESTRIL) 40 MG tablet, Take 1 tablet (40 mg total) by mouth daily., Disp: 90 tablet, Rfl: 1   niacin (NIASPAN) 500 MG ER tablet, Take 1 tablet (500 mg total) by mouth at bedtime., Disp: 90 tablet, Rfl: 1  Current Facility-Administered Medications:    0.9 %  sodium chloride infusion, 500 mL, Intravenous, Once, Lynann Bologna, MD  Review of Systems:  Negative except as indicated in HPI.   Physical Exam: There were no vitals filed for this visit. There is no height or weight on file to calculate BMI.  Physical Exam Vitals reviewed.  Constitutional:      Appearance: Normal  appearance.  HENT:     Head: Normocephalic and atraumatic.  Eyes:     Conjunctiva/sclera: Conjunctivae normal.     Pupils: Pupils are equal, round, and reactive to light.  Cardiovascular:     Rate and Rhythm: Normal rate and regular rhythm.  Pulmonary:     Effort: Pulmonary effort is normal.     Breath sounds: Normal breath sounds.  Skin:    General: Skin is warm and dry.  Neurological:     General: No focal deficit present.     Mental Status: He is alert and oriented to person, place, and time.  Psychiatric:        Mood and Affect: Mood normal.        Behavior: Behavior normal.        Thought Content: Thought content normal.        Judgment: Judgment normal.       Impression and Plan:  Type II  diabetes mellitus with hyperosmolarity, uncontrolled (HCC) -     POCT glycosylated hemoglobin (Hb A1C)  Pure hypercholesterolemia Assessment & Plan: Refill atorvastatin 80 mg daily.  Last LDL was 41 in April 2024.  Orders: -     Atorvastatin Calcium; Take 1 tablet (80 mg total) by mouth daily.  Dispense: 90 tablet; Refill: 1 -     Niacin ER (Antihyperlipidemic); Take 1 tablet (500 mg total) by mouth at bedtime.  Dispense: 90 tablet; Refill: 1  Essential hypertension, benign Assessment & Plan: Blood pressure is well-controlled on labetalol and lisinopril.  Orders: -     Labetalol HCl; TAKE 1 TABLET BY MOUTH THREE TIMES A DAY  Dispense: 270 tablet; Refill: 1 -     Lisinopril; Take 1 tablet (40 mg total) by mouth daily.  Dispense: 90 tablet; Refill: 1  CORONARY ATHEROSCLEROSIS NATIVE CORONARY ARTERY Assessment & Plan: Status post MI 25 years ago.  Is stable, uses nitroglycerin very sporadically.     Time spent: 46 minutes reviewing chart, interviewing and examining patient and formulating plan of care.     Chaya Jan, MD [facility]

## 2022-10-06 NOTE — Assessment & Plan Note (Signed)
Status post MI 25 years ago.  Is stable, uses nitroglycerin very sporadically.

## 2023-01-05 ENCOUNTER — Other Ambulatory Visit: Payer: Self-pay | Admitting: Internal Medicine

## 2023-01-05 DIAGNOSIS — E7800 Pure hypercholesterolemia, unspecified: Secondary | ICD-10-CM

## 2023-01-05 DIAGNOSIS — I10 Essential (primary) hypertension: Secondary | ICD-10-CM

## 2023-04-08 ENCOUNTER — Encounter: Payer: Self-pay | Admitting: Internal Medicine

## 2023-04-08 ENCOUNTER — Ambulatory Visit (INDEPENDENT_AMBULATORY_CARE_PROVIDER_SITE_OTHER): Payer: No Typology Code available for payment source | Admitting: Internal Medicine

## 2023-04-08 VITALS — BP 130/80 | HR 67 | Temp 97.6°F | Wt 165.4 lb

## 2023-04-08 DIAGNOSIS — R7989 Other specified abnormal findings of blood chemistry: Secondary | ICD-10-CM | POA: Diagnosis not present

## 2023-04-08 DIAGNOSIS — I10 Essential (primary) hypertension: Secondary | ICD-10-CM

## 2023-04-08 DIAGNOSIS — E1100 Type 2 diabetes mellitus with hyperosmolarity without nonketotic hyperglycemic-hyperosmolar coma (NKHHC): Secondary | ICD-10-CM

## 2023-04-08 DIAGNOSIS — E7800 Pure hypercholesterolemia, unspecified: Secondary | ICD-10-CM | POA: Diagnosis not present

## 2023-04-08 DIAGNOSIS — I2510 Atherosclerotic heart disease of native coronary artery without angina pectoris: Secondary | ICD-10-CM

## 2023-04-08 LAB — POCT GLYCOSYLATED HEMOGLOBIN (HGB A1C): Hemoglobin A1C: 5.7 % — AB (ref 4.0–5.6)

## 2023-04-08 NOTE — Progress Notes (Signed)
 Established Patient Office Visit     CC/Reason for Visit: Follow-up chronic medical conditions  HPI: [name] [name] is a 73 y.o. male who is coming in today for the above mentioned reasons. Past Medical History is significant for: Hypertension, hyperlipidemia, impaired glucose tolerance, coronary artery disease.  He is feeling well without concerns or complaints.   Past Medical/Surgical History: Past Medical History:  Diagnosis Date   ABNORMAL EKG 12/20/2008   Qualifier: Diagnosis of  By: Gala Romney, MD, Trixie Dredge    Arthritis    left wrist   Colon polyp 10/2017   Coronary artery disease    Hx of coronary artery disease    Hyperlipidemia    Hypertension    Lower GI bleed 11/05/2017   Myocardial infarction (lateral wall) [facility]) May 2000   inferior lateral wall myocardial infarction    Past Surgical History:  Procedure Laterality Date   ANGIOPLASTY     CARDIAC CATHETERIZATION      LAD 60-70% mid lesion, ramus 30% ostial lesion, circumflex was dominant with an occluded obtuse marginal branch which was angioplastied.  EF was 55%.    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 His smoking use included cigarettes. He has never used smokeless tobacco. He reports that he does not currently use alcohol. He reports that he does not use drugs.  Allergies: No Known Allergies  Family History:  Family History  Problem Relation Age of Onset   Heart disease Mother    Hyperlipidemia Mother    Hypertension Mother    Diabetes Mother    Diabetes Brother    Colon cancer Neg Hx    Colon polyps Neg Hx    Esophageal cancer Neg Hx    Rectal cancer Neg Hx    Stomach cancer Neg Hx      Current Outpatient Medications:    aspirin 81 MG chewable tablet, Chew 1 tablet (81 mg total) by mouth daily. Hold Aspirin for 5 days, then resume, Disp: , Rfl:     atorvastatin (LIPITOR) 80 MG tablet, TAKE 1 TABLET BY MOUTH EVERY DAY, Disp: 90 tablet, Rfl: 1   labetalol (NORMODYNE) 200 MG tablet, TAKE 1 TABLET BY MOUTH THREE TIMES A DAY, Disp: 270 tablet, Rfl: 1   lisinopril (ZESTRIL) 40 MG tablet, TAKE 1 TABLET BY MOUTH EVERY DAY, Disp: 90 tablet, Rfl: 1   Multiple Vitamin (MULTI-VITAMINS) TABS, Take 1 tablet by mouth daily. , Disp: , Rfl:    niacin (NIASPAN) 500 MG ER tablet, Take 1 tablet (500 mg total) by mouth at bedtime., Disp: 90 tablet, Rfl: 1   nitroGLYCERIN (NITROSTAT) 0.4 MG SL tablet, Place 1 tablet (0.4 mg total) under the tongue every 5 (five) minutes as needed for chest pain., Disp: 25 tablet, Rfl: 0  Current Facility-Administered Medications:    0.9 %  sodium chloride infusion, 500 mL, Intravenous, Once, Lynann Bologna, MD  Review of Systems:  Negative unless indicated in HPI.   Physical Exam: Vitals:   04/08/23 0757  BP: 130/80  Pulse: 67  Temp: 97.6 F (36.4 C)  TempSrc: Oral  SpO2: 98%  Weight: 165 lb 6.4 oz (75 kg)    Body mass index is 29.3 kg/m.   Physical Exam Vitals reviewed.  Constitutional:      Appearance: Normal appearance.  HENT:     Head: Normocephalic and  atraumatic.  Eyes:     Conjunctiva/sclera: Conjunctivae normal.  Cardiovascular:     Rate and Rhythm: Normal rate and regular rhythm.  Pulmonary:     Effort: Pulmonary effort is normal.     Breath sounds: Normal breath sounds.  Skin:    General: Skin is warm and dry.  Neurological:     General: No focal deficit present.     Mental Status: He is alert and oriented to person, place, and time.  Psychiatric:        Mood and Affect: Mood normal.        Behavior: Behavior normal.        Thought Content: Thought content normal.        Judgment: Judgment normal.      Impression and Plan:  Essential hypertension, benign  Type II diabetes mellitus with hyperosmolarity, uncontrolled (HCC) -     POCT glycosylated hemoglobin (Hb A1C) -      Microalbumin / creatinine urine ratio; Future  Pure hypercholesterolemia  Low vitamin D level  Atherosclerosis of native coronary artery of native heart without angina pectoris   -Blood pressure is well-controlled on current. -A1c is stable in the prediabetic range at 5.7.  - At last check his LDL was well controlled at 41. -Schedule follow-up for labs in 6 months.  Time spent:30 minutes reviewing chart, interviewing and examining patient and formulating plan of care.     Chaya Jan, MD [facility]

## 2023-08-24 ENCOUNTER — Encounter: Payer: Self-pay | Admitting: Registered Nurse

## 2023-08-24 ENCOUNTER — Telehealth: Payer: Self-pay | Admitting: Registered Nurse

## 2023-08-24 DIAGNOSIS — Z0000 Encounter for general adult medical examination without abnormal findings: Secondary | ICD-10-CM

## 2023-08-24 NOTE — Telephone Encounter (Signed)
 Patient completed labs and BP with PCM.  NP completed provider portion Be Well paperwork and RN Apolinar instructed to contact patient for completion of forms by patient e.g. ROI and tobacco attestation BP 130/80 and A1c 5.7 met requirements for insurance discount starting 10 Nov 2023

## 2023-08-24 NOTE — Telephone Encounter (Signed)
 Patient came to clinic signed paperwork nonsmoker/vaper on attestation previous 12 months.  [employer] form completed and given to HR Jen for insurance discount starting 10 Nov 2023

## 2023-09-26 ENCOUNTER — Other Ambulatory Visit: Payer: Self-pay | Admitting: Internal Medicine

## 2023-09-26 DIAGNOSIS — I10 Essential (primary) hypertension: Secondary | ICD-10-CM

## 2023-09-26 DIAGNOSIS — E7800 Pure hypercholesterolemia, unspecified: Secondary | ICD-10-CM

## 2023-10-04 ENCOUNTER — Ambulatory Visit (INDEPENDENT_AMBULATORY_CARE_PROVIDER_SITE_OTHER): Payer: No Typology Code available for payment source | Admitting: Internal Medicine

## 2023-10-04 VITALS — BP 130/88 | HR 62 | Temp 97.8°F | Wt 155.1 lb

## 2023-10-04 DIAGNOSIS — R7989 Other specified abnormal findings of blood chemistry: Secondary | ICD-10-CM

## 2023-10-04 DIAGNOSIS — E1100 Type 2 diabetes mellitus with hyperosmolarity without nonketotic hyperglycemic-hyperosmolar coma (NKHHC): Secondary | ICD-10-CM

## 2023-10-04 DIAGNOSIS — R7302 Impaired glucose tolerance (oral): Secondary | ICD-10-CM | POA: Insufficient documentation

## 2023-10-04 DIAGNOSIS — I10 Essential (primary) hypertension: Secondary | ICD-10-CM

## 2023-10-04 DIAGNOSIS — E7800 Pure hypercholesterolemia, unspecified: Secondary | ICD-10-CM | POA: Diagnosis not present

## 2023-10-04 DIAGNOSIS — I2510 Atherosclerotic heart disease of native coronary artery without angina pectoris: Secondary | ICD-10-CM | POA: Diagnosis not present

## 2023-10-04 LAB — MICROALBUMIN / CREATININE URINE RATIO
Creatinine,U: 133.6 mg/dL
Microalb Creat Ratio: 368 mg/g — ABNORMAL HIGH (ref 0.0–30.0)
Microalb, Ur: 49.2 mg/dL — ABNORMAL HIGH (ref 0.0–1.9)

## 2023-10-04 LAB — CBC WITH DIFFERENTIAL/PLATELET
Basophils Absolute: 0 K/uL (ref 0.0–0.1)
Basophils Relative: 0.6 % (ref 0.0–3.0)
Eosinophils Absolute: 0.2 K/uL (ref 0.0–0.7)
Eosinophils Relative: 4 % (ref 0.0–5.0)
HCT: 43.1 % (ref 39.0–52.0)
Hemoglobin: 14.6 g/dL (ref 13.0–17.0)
Lymphocytes Relative: 23.3 % (ref 12.0–46.0)
Lymphs Abs: 1.4 K/uL (ref 0.7–4.0)
MCHC: 33.8 g/dL (ref 30.0–36.0)
MCV: 84.9 fl (ref 78.0–100.0)
Monocytes Absolute: 0.6 K/uL (ref 0.1–1.0)
Monocytes Relative: 9.6 % (ref 3.0–12.0)
Neutro Abs: 3.7 K/uL (ref 1.4–7.7)
Neutrophils Relative %: 62.5 % (ref 43.0–77.0)
Platelets: 190 K/uL (ref 150.0–400.0)
RBC: 5.08 Mil/uL (ref 4.22–5.81)
RDW: 12.8 % (ref 11.5–15.5)
WBC: 5.9 K/uL (ref 4.0–10.5)

## 2023-10-04 LAB — COMPREHENSIVE METABOLIC PANEL WITH GFR
ALT: 31 U/L (ref 0–53)
AST: 26 U/L (ref 0–37)
Albumin: 4.3 g/dL (ref 3.5–5.2)
Alkaline Phosphatase: 60 U/L (ref 39–117)
BUN: 29 mg/dL — ABNORMAL HIGH (ref 6–23)
CO2: 27 meq/L (ref 19–32)
Calcium: 9.4 mg/dL (ref 8.4–10.5)
Chloride: 107 meq/L (ref 96–112)
Creatinine, Ser: 1.28 mg/dL (ref 0.40–1.50)
GFR: 55.59 mL/min — ABNORMAL LOW (ref >60.00)
Glucose, Bld: 119 mg/dL — ABNORMAL HIGH (ref 70–99)
Potassium: 4.9 meq/L (ref 3.5–5.1)
Sodium: 144 meq/L (ref 135–145)
Total Bilirubin: 0.7 mg/dL (ref 0.2–1.2)
Total Protein: 7.2 g/dL (ref 6.0–8.3)

## 2023-10-04 LAB — POCT GLYCOSYLATED HEMOGLOBIN (HGB A1C): Hemoglobin A1C: 5.5 % (ref 4.0–5.6)

## 2023-10-04 LAB — LIPID PANEL
Cholesterol: 111 mg/dL (ref 0–200)
HDL: 42.2 mg/dL (ref >39.00)
LDL Cholesterol: 51 mg/dL (ref 0–99)
NonHDL: 68.76
Total CHOL/HDL Ratio: 3
Triglycerides: 87 mg/dL (ref 0.0–149.0)
VLDL: 17.4 mg/dL (ref 0.0–40.0)

## 2023-10-04 LAB — VITAMIN D 25 HYDROXY (VIT D DEFICIENCY, FRACTURES): VITD: 52.07 ng/mL (ref 30.00–100.00)

## 2023-10-04 MED ORDER — AMLODIPINE BESYLATE 5 MG PO TABS: 5.0000 mg | ORAL_TABLET | Freq: Every day | ORAL | 1 refills | Status: AC

## 2023-10-04 NOTE — Progress Notes (Signed)
 Established Patient Office Visit     CC/Reason for Visit: Follow-up chronic conditions  HPI: [name] [name] is a 73 y.o. male who is coming in today for the above mentioned reasons. Past Medical History is significant for: Hypertension, hyperlipidemia, impaired glucose tolerance, coronary artery disease.  He is feeling good.  He has stated that blood pressure at home has been in the 140-150 systolics with diastolics in the mid to upper 80s.  He has been taking lisinopril  40 mg for years.   Past Medical/Surgical History: Past Medical History:  Diagnosis Date   ABNORMAL EKG 12/20/2008   Qualifier: Diagnosis of  By: Cherrie, MD, CODY Toribio SAUNDERS    Arthritis    left wrist   Colon polyp 10/2017   Coronary artery disease    Hx of coronary artery disease    Hyperlipidemia    Hypertension    Lower GI bleed 11/05/2017   Myocardial infarction (lateral wall) [facility]) May 2000   inferior lateral wall myocardial infarction    Past Surgical History:  Procedure Laterality Date   ANGIOPLASTY     CARDIAC CATHETERIZATION      LAD 60-70% mid lesion, ramus 30% ostial lesion, circumflex was dominant with an occluded obtuse marginal branch which was angioplastied.  EF was 55%.    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 His smoking use included cigarettes. He has never used smokeless tobacco. He reports that he does not currently use alcohol. He reports that he does not use drugs.  Allergies: No Known Allergies  Family History:  Family History  Problem Relation Age of Onset   Heart disease Mother    Hyperlipidemia Mother    Hypertension Mother    Diabetes Mother    Diabetes Brother    Colon cancer Neg Hx    Colon polyps Neg Hx    Esophageal cancer Neg Hx    Rectal cancer Neg Hx    Stomach cancer Neg Hx      Current Outpatient Medications:     amLODipine  (NORVASC ) 5 MG tablet, Take 1 tablet (5 mg total) by mouth daily., Disp: 90 tablet, Rfl: 1   aspirin  81 MG chewable tablet, Chew 1 tablet (81 mg total) by mouth daily. Hold Aspirin  for 5 days, then resume, Disp: , Rfl:    atorvastatin  (LIPITOR) 80 MG tablet, TAKE 1 TABLET BY MOUTH EVERY DAY, Disp: 90 tablet, Rfl: 0   labetalol  (NORMODYNE ) 200 MG tablet, TAKE 1 TABLET BY MOUTH THREE TIMES A DAY, Disp: 270 tablet, Rfl: 0   lisinopril  (ZESTRIL ) 40 MG tablet, TAKE 1 TABLET BY MOUTH EVERY DAY, Disp: 90 tablet, Rfl: 0   Multiple Vitamin (MULTI-VITAMINS) TABS, Take 1 tablet by mouth daily. , Disp: , Rfl:    niacin  (NIASPAN ) 500 MG ER tablet, Take 1 tablet (500 mg total) by mouth at bedtime., Disp: 90 tablet, Rfl: 1   nitroGLYCERIN  (NITROSTAT ) 0.4 MG SL tablet, Place 1 tablet (0.4 mg total) under the tongue every 5 (five) minutes as needed for chest pain., Disp: 25 tablet, Rfl: 0  Current Facility-Administered Medications:    0.9 %  sodium chloride  infusion, 500 mL, Intravenous, Once, Charlanne Groom, MD  Review of Systems:  Negative unless indicated in HPI.   Physical Exam: Vitals:   10/04/23 0705  BP: 130/88  Pulse: 62  Temp: 97.8 F (36.6  C)  TempSrc: Oral  SpO2: 99%  Weight: 155 lb 1.6 oz (70.4 kg)    Body mass index is 27.47 kg/m.   Physical Exam Vitals reviewed.  Constitutional:      Appearance: Normal appearance.  HENT:     Head: Normocephalic and atraumatic.  Eyes:     Conjunctiva/sclera: Conjunctivae normal.  Cardiovascular:     Rate and Rhythm: Normal rate and regular rhythm.  Pulmonary:     Effort: Pulmonary effort is normal.     Breath sounds: Normal breath sounds.  Skin:    General: Skin is warm and dry.  Neurological:     General: No focal deficit present.     Mental Status: He is alert and oriented to person, place, and time.  Psychiatric:        Mood and Affect: Mood normal.        Behavior: Behavior normal.        Thought Content: Thought  content normal.        Judgment: Judgment normal.      Impression and Plan:  IGT (impaired glucose tolerance) -     POCT glycosylated hemoglobin (Hb A1C)  Pure hypercholesterolemia -     Lipid panel; Future  Essential hypertension, benign -     CBC with Differential/Platelet; Future -     Comprehensive metabolic panel with GFR; Future -     amLODIPine  Besylate; Take 1 tablet (5 mg total) by mouth daily.  Dispense: 90 tablet; Refill: 1  Low vitamin D  level -     VITAMIN D  25 Hydroxy (Vit-D Deficiency, Fractures); Future  Atherosclerosis of native coronary artery of native heart without angina pectoris   - A1c of 5.5 remained stable in the prediabetic range, continue to work on lifestyle changes. - Blood pressure remains elevated, continue lisinopril  40, add amlodipine  5 mg daily, return in 3 months for follow-up.  Continue ambulatory blood pressure measurements. - Check lipids today, continue atorvastatin  80 mg. - Check vitamin D  given his history of vitamin D  deficiency.  Time spent:32 minutes reviewing chart, interviewing and examining patient and formulating plan of care.     Tully Theophilus Andrews, MD [facility]

## 2023-10-06 ENCOUNTER — Ambulatory Visit: Payer: Self-pay | Admitting: Internal Medicine

## 2023-10-06 DIAGNOSIS — E1129 Type 2 diabetes mellitus with other diabetic kidney complication: Secondary | ICD-10-CM

## 2023-12-25 ENCOUNTER — Other Ambulatory Visit: Payer: Self-pay | Admitting: Internal Medicine

## 2023-12-25 DIAGNOSIS — E7800 Pure hypercholesterolemia, unspecified: Secondary | ICD-10-CM

## 2023-12-25 DIAGNOSIS — I10 Essential (primary) hypertension: Secondary | ICD-10-CM

## 2024-04-05 ENCOUNTER — Ambulatory Visit: Admitting: Internal Medicine
